# Patient Record
Sex: Female | Born: 1985 | Race: White | Hispanic: No | Marital: Single | Smoking: Never smoker
Health system: Southern US, Community
[De-identification: ages and names within clinical notes are randomized; demographics above are authoritative.]

## PROBLEM LIST (undated history)

## (undated) DIAGNOSIS — G43909 Migraine, unspecified, not intractable, without status migrainosus: Secondary | ICD-10-CM

## (undated) DIAGNOSIS — E282 Polycystic ovarian syndrome: Secondary | ICD-10-CM

## (undated) DIAGNOSIS — F4310 Post-traumatic stress disorder, unspecified: Secondary | ICD-10-CM

## (undated) DIAGNOSIS — F419 Anxiety disorder, unspecified: Secondary | ICD-10-CM

## (undated) DIAGNOSIS — F32A Depression, unspecified: Secondary | ICD-10-CM

## (undated) HISTORY — DX: Migraine, unspecified, not intractable, without status migrainosus: G43.909

## (undated) HISTORY — PX: WISDOM TOOTH EXTRACTION: SHX21

---

## 2007-01-31 ENCOUNTER — Emergency Department (HOSPITAL_COMMUNITY): Admission: EM | Admit: 2007-01-31 | Discharge: 2007-02-01 | Payer: Self-pay | Admitting: Emergency Medicine

## 2008-08-06 ENCOUNTER — Emergency Department (HOSPITAL_COMMUNITY): Admission: EM | Admit: 2008-08-06 | Discharge: 2008-08-06 | Payer: Self-pay | Admitting: Emergency Medicine

## 2009-06-06 ENCOUNTER — Emergency Department (HOSPITAL_COMMUNITY): Admission: EM | Admit: 2009-06-06 | Discharge: 2009-06-06 | Payer: Self-pay | Admitting: Emergency Medicine

## 2011-04-19 LAB — URINALYSIS, ROUTINE W REFLEX MICROSCOPIC
Bilirubin Urine: NEGATIVE
Glucose, UA: NEGATIVE
Ketones, ur: NEGATIVE
Nitrite: NEGATIVE
Specific Gravity, Urine: 1.01
pH: 6.5

## 2011-04-19 LAB — URINE MICROSCOPIC-ADD ON

## 2011-04-19 LAB — PREGNANCY, URINE: Preg Test, Ur: NEGATIVE

## 2011-08-13 ENCOUNTER — Emergency Department (HOSPITAL_COMMUNITY)
Admission: EM | Admit: 2011-08-13 | Discharge: 2011-08-13 | Disposition: A | Discharge status: Home / Self Care | Payer: Self-pay | Attending: Emergency Medicine | Admitting: Emergency Medicine

## 2011-08-13 ENCOUNTER — Encounter (HOSPITAL_COMMUNITY): Payer: Self-pay

## 2011-08-13 ENCOUNTER — Emergency Department (HOSPITAL_COMMUNITY): Payer: Self-pay

## 2011-08-13 DIAGNOSIS — O200 Threatened abortion: Secondary | ICD-10-CM | POA: Insufficient documentation

## 2011-08-13 LAB — WET PREP, GENITAL
Clue Cells Wet Prep HPF POC: NONE SEEN
Yeast Wet Prep HPF POC: NONE SEEN

## 2011-08-13 LAB — URINALYSIS, ROUTINE W REFLEX MICROSCOPIC
Ketones, ur: NEGATIVE mg/dL
Leukocytes, UA: NEGATIVE
Nitrite: NEGATIVE
Protein, ur: NEGATIVE mg/dL
pH: 5.5 (ref 5.0–8.0)

## 2011-08-13 LAB — CBC
Hemoglobin: 12.8 g/dL (ref 12.0–15.0)
MCH: 30.5 pg (ref 26.0–34.0)
MCHC: 34 g/dL (ref 30.0–36.0)
MCV: 90 fL (ref 78.0–100.0)
RBC: 4.19 MIL/uL (ref 3.87–5.11)

## 2011-08-13 LAB — URINE MICROSCOPIC-ADD ON

## 2011-08-13 LAB — PREGNANCY, URINE: Preg Test, Ur: POSITIVE — AB

## 2011-08-13 MED ORDER — HYDROCODONE-ACETAMINOPHEN 5-325 MG PO TABS: 2.0000 | ORAL_TABLET | Freq: Once | ORAL | Status: DC

## 2011-08-13 MED ORDER — HYDROCODONE-ACETAMINOPHEN 5-325 MG PO TABS
1.0000 | ORAL_TABLET | Freq: Once | ORAL | Status: AC
  Administered 2011-08-13: 1 via ORAL
  Filled 2011-08-13: qty 1

## 2011-08-13 NOTE — ED Provider Notes (Addendum)
History     CSN: [id number]  Arrival date & time 08/13/11  1349   First MD Initiated Contact with Patient 08/13/11 1357      Chief Complaint  Patient presents with  . Abdominal Pain  . Nausea    (Consider location/radiation/quality/duration/timing/severity/associated sxs/prior treatment) Patient is a 26 y.o. female presenting with abdominal pain. The history is provided by the patient.  Abdominal Pain The primary symptoms of the illness include abdominal pain. The primary symptoms of the illness do not include fever or dysuria.  Symptoms associated with the illness do not include back pain.  pt c/o lower abd/suprapubic cramping pain in past few days corresponding to her period. States very similar menstrual cramping in past. Takes midol prn. Dull, cramping pain, non radiating. No assoc nv. No fever or chills. Denies constipation or diarrhea. No hx endometriosis or ovarian cyst. No prior abd surgery. Denies exacerbating or alleviating factors. States current period normal, normal time, normal amt bleeding. No back or flank pain. No dysuria or hematuria. No hx kidney stones. Normal appetite.   History reviewed. No pertinent past medical history.  History reviewed. No pertinent past surgical history.  No family history on file.  History  Substance Use Topics  . Smoking status: Never Smoker   . Smokeless tobacco: Not on file  . Alcohol Use: No    OB History    Grav Para Term Preterm Abortions TAB SAB Ect Mult Living                  Review of Systems  Constitutional: Negative for fever.  HENT: Negative for neck pain.   Respiratory: Negative for cough.   Cardiovascular: Negative for chest pain.  Gastrointestinal: Positive for abdominal pain.  Genitourinary: Negative for dysuria and flank pain.  Musculoskeletal: Negative for back pain.  Skin: Negative for rash.  Neurological: Negative for headaches.  Hematological: Does not bruise/bleed easily.  Psychiatric/Behavioral:  Negative for confusion.    Allergies  Amoxapine and related  Home Medications   Current Outpatient Rx  Name Route Sig Dispense Refill  . IBUPROFEN 200 MG PO TABS Oral Take 800 mg by mouth every 6 (six) hours as needed. pain      BP 124/77  Pulse 104  Temp(Src) 97.9 F (36.6 C) (Oral)  Resp 26  Ht 5\' 4"  (1.626 m)  Wt 229 lb (103.874 kg)  BMI 39.31 kg/m2  SpO2 100%  LMP 08/06/2011  Physical Exam  Nursing note and vitals reviewed. Constitutional: She appears well-developed and well-nourished. No distress.  Eyes: Conjunctivae are normal. No scleral icterus.  Neck: Neck supple. No tracheal deviation present.  Cardiovascular: Normal rate.   Pulmonary/Chest: Effort normal. No respiratory distress.  Abdominal: Soft. Normal appearance and bowel sounds are normal. She exhibits no distension and no mass. There is no tenderness. There is no rebound and no guarding.  Genitourinary:       No cva tenderness.  Small amt blood in vaginal, no active bleeding, cervix closed. No cmt. No adx masses/ tenderness.   Musculoskeletal: She exhibits no edema.  Neurological: She is alert.  Skin: Skin is warm and dry. No rash noted.  Psychiatric: She has a normal mood and affect.    ED Course  Procedures (including critical care time)  Results for orders placed during the hospital encounter of 08/13/11  URINALYSIS, ROUTINE W REFLEX MICROSCOPIC      Component Value Range   Color, Urine YELLOW  YELLOW    APPearance CLEAR  CLEAR  Specific Gravity, Urine >1.030 (*) 1.005 - 1.030    pH 5.5  5.0 - 8.0    Glucose, UA NEGATIVE  NEGATIVE (mg/dL)   Hgb urine dipstick LARGE (*) NEGATIVE    Bilirubin Urine NEGATIVE  NEGATIVE    Ketones, ur NEGATIVE  NEGATIVE (mg/dL)   Protein, ur NEGATIVE  NEGATIVE (mg/dL)   Urobilinogen, UA 0.2  0.0 - 1.0 (mg/dL)   Nitrite NEGATIVE  NEGATIVE    Leukocytes, UA NEGATIVE  NEGATIVE   PREGNANCY, URINE      Component Value Range   Preg Test, Ur POSITIVE (*) NEGATIVE    URINE MICROSCOPIC-ADD ON      Component Value Range   Squamous Epithelial / LPF RARE  RARE    WBC, UA 0-2  <3 (WBC/hpf)   RBC / HPF TOO NUMEROUS TO COUNT  <3 (RBC/hpf)   Bacteria, UA FEW (*) RARE   CBC      Component Value Range   WBC 16.7 (*) 4.0 - 10.5 (K/uL)   RBC 4.19  3.87 - 5.11 (MIL/uL)   Hemoglobin 12.8  12.0 - 15.0 (g/dL)   HCT 78.2  95.6 - 21.3 (%)   MCV 90.0  78.0 - 100.0 (fL)   MCH 30.5  26.0 - 34.0 (pg)   MCHC 34.0  30.0 - 36.0 (g/dL)   RDW 08.6  57.8 - 46.9 (%)   Platelets 286  150 - 400 (K/uL)  ABO/RH      Component Value Range   ABO/RH(D) A POS    HCG, QUANTITATIVE, PREGNANCY      Component Value Range   hCG, Beta Chain, Quant, S 102 (*) <5 (mIU/mL)  WET PREP, GENITAL      Component Value Range   Yeast Wet Prep HPF POC NONE SEEN  NONE SEEN    Trich, Wet Prep NONE SEEN  NONE SEEN    Clue Cells Wet Prep HPF POC NONE SEEN  NONE SEEN    WBC, Wet Prep HPF POC FEW (*) NONE SEEN    US Ob Comp Less 14 Wks  08/13/2011  *RADIOLOGY REPORT*  Clinical Data: Pelvic pain.  Rule out ectopic pregnancy. Quantitative beta HCG is 102.  By LMP, the patient is 6 days pregnant.  EDC by LMP is 05/13/2012.  OBSTETRIC <14 WK ULTRASOUND  Technique:  Transabdominal ultrasound was performed for evaluation of the gestation as well as the maternal uterus and adnexal regions.  Comparison:  None  Intrauterine gestational sac: Possible; small fluid collection present measuring 3.2 mm. Yolk sac: Not visualized. Embryo: Not visualized. Cardiac Activity: Not visualized.  MSD: 3.2 mm  5w  0d  Maternal uterus/Adnexae: No subchorionic hemorrhage is identified.  Limited evaluation of the right ovary.  A cystic area seen only on transabdominal portion of the exam measures 4.0 x 4.3 x 3.8 cm.  The left ovary is only seen on the transabdominal portion of the exam and is unremarkable in appearance.  Note is made of thickened endometrium within the lower uterine segment.  Attention to this region is recommended  that follow-up.  IMPRESSION:  1.  Small fluid collection within the endometrial canal may represent intrauterine gestational sac.  However, the lack of visualized yolk sac makes this a difficult determination at this point. 2.  Thickened endometrial canal in the lower uterine segment warrants followup. 3.  No evidence for ectopic pregnancy. 4.  Follow-up serial quantitative beta HCG and follow-up ultrasound is suggested.  Original Report Authenticated By: Patterson Hammersmith, M.D.  US Ob Transvaginal  08/13/2011  *RADIOLOGY REPORT*  Clinical Data: Pelvic pain.  Rule out ectopic pregnancy. Quantitative beta HCG is 102.  By LMP, the patient is 6 days pregnant.  EDC by LMP is 05/13/2012.  OBSTETRIC <14 WK ULTRASOUND  Technique:  Transabdominal ultrasound was performed for evaluation of the gestation as well as the maternal uterus and adnexal regions.  Comparison:  None  Intrauterine gestational sac: Possible; small fluid collection present measuring 3.2 mm. Yolk sac: Not visualized. Embryo: Not visualized. Cardiac Activity: Not visualized.  MSD: 3.2 mm  5w  0d  Maternal uterus/Adnexae: No subchorionic hemorrhage is identified.  Limited evaluation of the right ovary.  A cystic area seen only on transabdominal portion of the exam measures 4.0 x 4.3 x 3.8 cm.  The left ovary is only seen on the transabdominal portion of the exam and is unremarkable in appearance.  Note is made of thickened endometrium within the lower uterine segment.  Attention to this region is recommended that follow-up.  IMPRESSION:  1.  Small fluid collection within the endometrial canal may represent intrauterine gestational sac.  However, the lack of visualized yolk sac makes this a difficult determination at this point. 2.  Thickened endometrial canal in the lower uterine segment warrants followup. 3.  No evidence for ectopic pregnancy. 4.  Follow-up serial quantitative beta HCG and follow-up ultrasound is suggested.  Original Report  Authenticated By: Patterson Hammersmith, M.D.         MDM  Pt has ride, does not have to drive. vicodin 1 po.   Pt back from bathroom, states had bm w resolution pain. abd soft nt.   Discussed pos u preg w pt, quant and additional labs added, u/s added. Pt states lnmp prior to current bleeding was 12/12.  abd soft nt.   Discussed u/s w pt, and need to follow up 2 days time for repeat quant, recheck.   Recheck pt no pain, no bleeding, abd soft nt.    Suzi Roots, MD 08/13/11 1547  Suzi Roots, MD 08/13/11 [phone]

## 2011-08-13 NOTE — ED Notes (Signed)
MD at bedside. 

## 2011-08-13 NOTE — ED Notes (Signed)
Pt DC to home with family.

## 2011-08-13 NOTE — ED Notes (Signed)
MD at bedside. Pelvic tray set up.

## 2011-08-13 NOTE — ED Notes (Signed)
Patient transported to Ultrasound 

## 2011-08-13 NOTE — ED Notes (Signed)
Family at bedside. Patient does not need anything at this time. 

## 2011-08-13 NOTE — ED Notes (Signed)
Pt presents with low abdominal pain and nausea. Pt states he last BM was today. Pt hyperventilating in triage.

## 2011-08-14 LAB — GC/CHLAMYDIA PROBE AMP, GENITAL: Chlamydia, DNA Probe: NEGATIVE

## 2011-08-16 ENCOUNTER — Emergency Department (HOSPITAL_COMMUNITY)
Admission: EM | Admit: 2011-08-16 | Discharge: 2011-08-16 | Disposition: A | Discharge status: Home / Self Care | Payer: Self-pay | Attending: Emergency Medicine | Admitting: Emergency Medicine

## 2011-08-16 ENCOUNTER — Encounter (HOSPITAL_COMMUNITY): Payer: Self-pay

## 2011-08-16 DIAGNOSIS — O200 Threatened abortion: Secondary | ICD-10-CM | POA: Insufficient documentation

## 2011-08-16 NOTE — ED Notes (Signed)
Pt reports found out Friday she was pregnant.  She came for abd pain and was diagnosed with threatened miscarriage.   Had HCG levels drawn and was told to return here for recheck of HCG levels.  Pt says started having vaginal bleeding on 02/03 and says bleeding is stopping now.  Prior to that her lmp was 12/03.  Denies any abd pain at this time.

## 2011-08-16 NOTE — ED Provider Notes (Signed)
History   This chart was scribed for Celene Kras, MD by Clarita Crane. The patient was seen in room APA12/[facility]12 and the patient's care was started at 0913.   CSN: [id number]  Arrival date & time 08/16/11  4098   First MD Initiated Contact with Patient 08/16/11 [phone]      Chief Complaint  Patient presents with  . Threatened Miscarriage    (Consider location/radiation/quality/duration/timing/severity/associated sxs/prior treatment) HPI Autumn Boyd is a 26 y.o. female who presents to the Emergency Department to have HCG levels rechecked after dx with threatened miscarriage in ED several days ago. Patient states she was evaluated in ED by Dr. Denton Lank 3 days ago for abdominal pain and was told at d/c to return to have HCG levels rechecked in several days. States HCG level 3 days ago was 102. Patient notes experiencing vaginal bleeding several days ago but states bleeding has resolved. Currently denies vaginal bleeding, dizziness, lightheadedness. Reports her blood type is A positive.   No pain or complaints now.  History reviewed. No pertinent past medical history.  History reviewed. No pertinent past surgical history.  No family history on file.  History  Substance Use Topics  . Smoking status: Never Smoker   . Smokeless tobacco: Not on file  . Alcohol Use: No    OB History    Grav Para Term Preterm Abortions TAB SAB Ect Mult Living                  Review of Systems 10 Systems reviewed and are negative for acute change except as noted in the HPI.  Allergies  Amoxicillin and Amoxapine and related  Home Medications   Current Outpatient Rx  Name Route Sig Dispense Refill  . IBUPROFEN 200 MG PO TABS Oral Take 800 mg by mouth every 6 (six) hours as needed. pain      BP 122/79  Pulse 101  Temp(Src) 98.5 F (36.9 C) (Oral)  Resp 20  Ht 5\' 4"  (1.626 m)  Wt 229 lb (103.874 kg)  BMI 39.31 kg/m2  LMP 08/08/2011  Physical Exam  Nursing note and vitals  reviewed. Constitutional: She appears well-developed and well-nourished. No distress.  HENT:  Head: Normocephalic and atraumatic.  Right Ear: External ear normal.  Left Ear: External ear normal.  Eyes: Conjunctivae are normal. Right eye exhibits no discharge. Left eye exhibits no discharge. No scleral icterus.  Neck: Neck supple. No tracheal deviation present.  Cardiovascular: Normal rate, regular rhythm and intact distal pulses.   Pulmonary/Chest: Effort normal and breath sounds normal. No stridor. No respiratory distress. She has no wheezes. She has no rales.  Abdominal: Soft. Bowel sounds are normal. She exhibits no distension. There is no tenderness. There is no rebound and no guarding.  Musculoskeletal: She exhibits no edema and no tenderness.  Neurological: She is alert. She has normal strength. No sensory deficit. Cranial nerve deficit:  no gross defecits noted. She exhibits normal muscle tone. She displays no seizure activity. Coordination normal.  Skin: Skin is warm and dry. No rash noted.  Psychiatric: She has a normal mood and affect.    ED Course  Procedures (including critical care time)  DIAGNOSTIC STUDIES:   COORDINATION OF CARE: 10:05AM- Patient informed of current plan for treatment and evaluation and agrees with plan at this time.     Labs Reviewed  HCG, QUANTITATIVE, PREGNANCY - Abnormal; Notable for the following:    hCG, Beta Chain, Quant, S 27 (*)    All  other components within normal limits   No results found.   1. Miscarriage, threatened, early pregnancy       MDM  Patient's quantitative hCG has decreased down to 27. Appears that she is having a miscarriage. Patient has not had any significant bleeding now. She's not had any abdominal pain. She did bring a sample of a blood clot that she had associated with her vaginal bleeding but there does not appear to be tissue on microscopic exam. Patient discharged home in stable condition she is to followup  with a primary care Dr. Routinely.she should have a repeat, multiple at some point in the next week to make sure that that is continued to decline to zero.  I personally performed the services described in this documentation, which was scribed in my presence.  The recorded information has been reviewed and considered.    Celene Kras, MD 08/16/11 1102

## 2012-02-09 ENCOUNTER — Encounter (HOSPITAL_COMMUNITY): Payer: Self-pay | Admitting: *Deleted

## 2012-02-09 ENCOUNTER — Emergency Department (HOSPITAL_COMMUNITY)
Admission: EM | Admit: 2012-02-09 | Discharge: 2012-02-09 | Disposition: A | Discharge status: Home / Self Care | Payer: Self-pay | Attending: Emergency Medicine | Admitting: Emergency Medicine

## 2012-02-09 DIAGNOSIS — N390 Urinary tract infection, site not specified: Secondary | ICD-10-CM | POA: Insufficient documentation

## 2012-02-09 LAB — URINALYSIS, ROUTINE W REFLEX MICROSCOPIC
Ketones, ur: NEGATIVE mg/dL
Protein, ur: 100 mg/dL — AB
Urobilinogen, UA: 1 mg/dL (ref 0.0–1.0)

## 2012-02-09 LAB — HCG, SERUM, QUALITATIVE: Preg, Serum: NEGATIVE

## 2012-02-09 MED ORDER — CIPROFLOXACIN HCL 250 MG PO TABS
500.0000 mg | ORAL_TABLET | Freq: Once | ORAL | Status: AC
  Administered 2012-02-09: 500 mg via ORAL
  Filled 2012-02-09: qty 2

## 2012-02-09 MED ORDER — CIPROFLOXACIN HCL 500 MG PO TABS: 500.0000 mg | ORAL_TABLET | Freq: Two times a day (BID) | ORAL | Status: AC

## 2012-02-09 NOTE — ED Provider Notes (Signed)
History     CSN: [id number]  Arrival date & time 02/09/12  1017   First MD Initiated Contact with Patient 02/09/12 1029      Chief Complaint  Patient presents with  . Dysuria  . Abdominal Pain    (Consider location/radiation/quality/duration/timing/severity/associated sxs/prior treatment) HPI Comments: Dysuria and frequency.  No hematuria.  No fever or chills.  Pt also has  Irregular menses and last was in mid July.    She asks that we do a pregnancy test and previous urine test have been negative even when she was pregnant.  Patient is a 26 y.o. female presenting with dysuria and abdominal pain. The history is provided by the patient. No language interpreter was used.  Dysuria  This is a new problem. Episode onset: several days ago. The problem occurs every urination. The problem has not changed since onset.The quality of the pain is described as burning. The pain is moderate. Associated symptoms include frequency. Pertinent negatives include no chills, no hematuria and no urgency. She has tried nothing for the symptoms.  Abdominal Pain The primary symptoms of the illness include abdominal pain and dysuria. The primary symptoms of the illness do not include fever, vaginal discharge or vaginal bleeding.  The dysuria is associated with frequency. The dysuria is not associated with hematuria or urgency.  Additional symptoms associated with the illness include frequency. Symptoms associated with the illness do not include chills, urgency or hematuria.    History reviewed. No pertinent past medical history.  History reviewed. No pertinent past surgical history.  No family history on file.  History  Substance Use Topics  . Smoking status: Never Smoker   . Smokeless tobacco: Not on file  . Alcohol Use: No    OB History    Grav Para Term Preterm Abortions TAB SAB Ect Mult Living                  Review of Systems  Constitutional: Negative for fever and chills.    Gastrointestinal: Positive for abdominal pain.  Genitourinary: Positive for dysuria and frequency. Negative for urgency, hematuria, vaginal bleeding and vaginal discharge.  All other systems reviewed and are negative.    Allergies  Amoxicillin and Amoxapine and related  Home Medications   Current Outpatient Rx  Name Route Sig Dispense Refill  . ACETAMINOPHEN 500 MG PO TABS Oral Take 500 mg by mouth every 6 (six) hours as needed. For pain/fever    . CIPROFLOXACIN HCL 500 MG PO TABS Oral Take 1 tablet (500 mg total) by mouth 2 (two) times daily. 14 tablet 0  . PRENATAL PO Oral Take 1 tablet by mouth daily.      BP 158/93  Pulse 102  Temp 98.1 F (36.7 C) (Oral)  Resp 16  Ht 5\' 4"  (1.626 m)  Wt 239 lb (108.41 kg)  BMI 41.02 kg/m2  SpO2 100%  LMP 01/14/2012  Physical Exam  Nursing note and vitals reviewed. Constitutional: She is oriented to person, place, and time. She appears well-developed and well-nourished. No distress.  HENT:  Head: Normocephalic and atraumatic.  Eyes: EOM are normal.  Neck: Normal range of motion.  Cardiovascular: Normal rate, regular rhythm and normal heart sounds.   Pulmonary/Chest: Effort normal and breath sounds normal.  Abdominal: Soft. She exhibits no distension. There is tenderness in the suprapubic area. There is no rigidity, no rebound, no guarding, no CVA tenderness, no tenderness at McBurney's point and negative Murphy's sign.    Musculoskeletal: Normal range  of motion.  Neurological: She is alert and oriented to person, place, and time.  Skin: Skin is warm and dry.  Psychiatric: She has a normal mood and affect. Judgment normal.    ED Course  Procedures (including critical care time)  Labs Reviewed  URINALYSIS, ROUTINE W REFLEX MICROSCOPIC - Abnormal; Notable for the following:    Color, Urine BROWN (*)  BIOCHEMICALS MAY BE AFFECTED BY COLOR   APPearance CLOUDY (*)     Glucose, UA 100 (*)     Hgb urine dipstick LARGE (*)      Bilirubin Urine MODERATE (*)     Protein, ur 100 (*)     Nitrite POSITIVE (*)     Leukocytes, UA SMALL (*)     All other components within normal limits  URINE MICROSCOPIC-ADD ON - Abnormal; Notable for the following:    Bacteria, UA MANY (*)     All other components within normal limits  PREGNANCY, URINE  HCG, SERUM, QUALITATIVE  URINE CULTURE   No results found.   1. UTI (urinary tract infection)       MDM  urine cx pending.   rx cippro 500 mg, 14 OTCphenazopyridine F/u with PCP       Evalina Field, PA 02/09/12 1146

## 2012-02-09 NOTE — ED Notes (Signed)
Pt states burning and pressure to lower abdomen  And burning with urination. Urinary frequency. Symptoms began yesterday

## 2012-02-10 NOTE — ED Provider Notes (Signed)
Medical screening examination/treatment/procedure(s) were performed by non-physician practitioner and as supervising physician I was immediately available for consultation/collaboration.   Laray Anger, DO 02/10/12 [phone]

## 2012-02-11 LAB — URINE CULTURE

## 2012-02-12 NOTE — ED Notes (Signed)
+   urine culture. Treated with Cipro, sensitive to same per protocol MD. 

## 2012-11-06 ENCOUNTER — Encounter: Payer: Self-pay | Admitting: *Deleted

## 2012-11-07 ENCOUNTER — Ambulatory Visit (INDEPENDENT_AMBULATORY_CARE_PROVIDER_SITE_OTHER): Payer: Self-pay | Admitting: Obstetrics & Gynecology

## 2012-11-07 ENCOUNTER — Encounter: Payer: Self-pay | Admitting: Obstetrics & Gynecology

## 2012-11-07 VITALS — BP 110/66 | Ht 64.5 in | Wt 248.0 lb

## 2012-11-07 DIAGNOSIS — Z3049 Encounter for surveillance of other contraceptives: Secondary | ICD-10-CM

## 2012-11-07 DIAGNOSIS — Z3200 Encounter for pregnancy test, result unknown: Secondary | ICD-10-CM

## 2012-11-07 DIAGNOSIS — Z3202 Encounter for pregnancy test, result negative: Secondary | ICD-10-CM

## 2012-11-08 ENCOUNTER — Telehealth: Payer: Self-pay | Admitting: Obstetrics & Gynecology

## 2012-11-08 NOTE — Telephone Encounter (Signed)
Left on pts answering machine that test was negative(quant. Hcg). Advised to call if had further questions. JSY

## 2013-06-22 ENCOUNTER — Emergency Department (HOSPITAL_COMMUNITY): Payer: Self-pay

## 2013-06-22 ENCOUNTER — Encounter (HOSPITAL_COMMUNITY): Payer: Self-pay | Admitting: Emergency Medicine

## 2013-06-22 ENCOUNTER — Emergency Department (HOSPITAL_COMMUNITY)
Admission: EM | Admit: 2013-06-22 | Discharge: 2013-06-22 | Disposition: A | Discharge status: Home / Self Care | Payer: Self-pay | Attending: Emergency Medicine | Admitting: Emergency Medicine

## 2013-06-22 DIAGNOSIS — R110 Nausea: Secondary | ICD-10-CM | POA: Insufficient documentation

## 2013-06-22 DIAGNOSIS — N938 Other specified abnormal uterine and vaginal bleeding: Secondary | ICD-10-CM | POA: Insufficient documentation

## 2013-06-22 DIAGNOSIS — Z3202 Encounter for pregnancy test, result negative: Secondary | ICD-10-CM | POA: Insufficient documentation

## 2013-06-22 DIAGNOSIS — N949 Unspecified condition associated with female genital organs and menstrual cycle: Secondary | ICD-10-CM | POA: Insufficient documentation

## 2013-06-22 DIAGNOSIS — R109 Unspecified abdominal pain: Secondary | ICD-10-CM | POA: Insufficient documentation

## 2013-06-22 DIAGNOSIS — Z79899 Other long term (current) drug therapy: Secondary | ICD-10-CM | POA: Insufficient documentation

## 2013-06-22 DIAGNOSIS — Z8679 Personal history of other diseases of the circulatory system: Secondary | ICD-10-CM | POA: Insufficient documentation

## 2013-06-22 LAB — BASIC METABOLIC PANEL
CO2: 25 mEq/L (ref 19–32)
Calcium: 9.6 mg/dL (ref 8.4–10.5)
Creatinine, Ser: 0.72 mg/dL (ref 0.50–1.10)
Glucose, Bld: 87 mg/dL (ref 70–99)
Sodium: 138 mEq/L (ref 135–145)

## 2013-06-22 LAB — CBC WITH DIFFERENTIAL/PLATELET
Eosinophils Relative: 3 % (ref 0–5)
HCT: 38.2 % (ref 36.0–46.0)
Lymphocytes Relative: 31 % (ref 12–46)
Lymphs Abs: 2.3 10*3/uL (ref 0.7–4.0)
MCV: 90.3 fL (ref 78.0–100.0)
Monocytes Absolute: 0.4 10*3/uL (ref 0.1–1.0)
RBC: 4.23 MIL/uL (ref 3.87–5.11)
WBC: 7.5 10*3/uL (ref 4.0–10.5)

## 2013-06-22 LAB — URINALYSIS, ROUTINE W REFLEX MICROSCOPIC
Glucose, UA: NEGATIVE mg/dL
Protein, ur: NEGATIVE mg/dL

## 2013-06-22 LAB — WET PREP, GENITAL
Clue Cells Wet Prep HPF POC: NONE SEEN
Trich, Wet Prep: NONE SEEN
WBC, Wet Prep HPF POC: NONE SEEN

## 2013-06-22 LAB — PREGNANCY, URINE: Preg Test, Ur: NEGATIVE

## 2013-06-22 LAB — URINE MICROSCOPIC-ADD ON

## 2013-06-22 MED ORDER — OXYCODONE-ACETAMINOPHEN 5-325 MG PO TABS: 2.0000 | ORAL_TABLET | ORAL | Status: DC | PRN

## 2013-06-22 MED ORDER — MEDROXYPROGESTERONE ACETATE 10 MG PO TABS: 10.0000 mg | ORAL_TABLET | Freq: Every day | ORAL | Status: DC

## 2013-06-22 NOTE — ED Notes (Signed)
Pt reports that she has been having vaginal bleeding since Nov. 25th, woke this am w/ nausea and ab cramping and headache.  Thinks she may be having a miscarriage. Had similar symptoms in feb. 2013 when she had one.  Took a home home preg. Test nov. 23rd and was negative.

## 2013-06-22 NOTE — ED Provider Notes (Signed)
CSN: [id number]     Arrival date & time 06/22/13  1402 History   First MD Initiated Contact with Patient 06/22/13 1631     Chief Complaint  Patient presents with  . Nausea  . Abdominal Cramping  . Vaginal Bleeding   (Consider location/radiation/quality/duration/timing/severity/associated sxs/prior Treatment) HPI....... lower abdominal cramping pain for the past 24 hours.   Patient has had a long history of dysfunctional uterine bleeding. She was followed by a local gynecologist for some time, but lost her insurance. She is on no hormonal replacement therapy at this time. Bleeding is described as light to heavy at times. She uses 3-4 pad changes daily. No vaginal discharge Past Medical History  Diagnosis Date  . Migraines    History reviewed. No pertinent past surgical history. Family History  Problem Relation Age of Onset  . Diabetes Mother   . Hypertension Father   . Diabetes Father   . Cancer Maternal Grandfather     mouth   History  Substance Use Topics  . Smoking status: Passive Smoke Exposure - Never Smoker  . Smokeless tobacco: Never Used  . Alcohol Use: No   OB History   Grav Para Term Preterm Abortions TAB SAB Ect Mult Living   1    1  1         Review of Systems  All other systems reviewed and are negative.    Allergies  Amoxicillin and Amoxapine and related  Home Medications   Current Outpatient Rx  Name  Route  Sig  Dispense  Refill  . acetaminophen (TYLENOL) 500 MG tablet   Oral   Take 500 mg by mouth every 6 (six) hours as needed. For pain/fever         . pyridoxine (B6 NATURAL) 100 MG tablet   Oral   Take 100 mg by mouth daily.         . vitamin B-12 (CYANOCOBALAMIN) 100 MCG tablet   Oral   Take 100 mcg by mouth daily.         . medroxyPROGESTERone (PROVERA) 10 MG tablet   Oral   Take 1 tablet (10 mg total) by mouth daily.   53 tablet   0     1 tablet 4 times a day for 4 days, then 1 tablet 3 ...   . oxyCODONE-acetaminophen  (PERCOCET) 5-325 MG per tablet   Oral   Take 2 tablets by mouth every 4 (four) hours as needed.   1 tablet   0    BP 122/70  Pulse 93  Temp(Src) 98.1 F (36.7 C) (Oral)  Resp 20  Ht 5\' 4"  (1.626 m)  Wt 240 lb (108.863 kg)  BMI 41.18 kg/m2  SpO2 98%  LMP 05/29/2013 Physical Exam  Nursing note and vitals reviewed. Constitutional: She is oriented to person, place, and time. She appears well-developed and well-nourished.  HENT:  Head: Normocephalic and atraumatic.  Eyes: Conjunctivae and EOM are normal. Pupils are equal, round, and reactive to light.  Neck: Normal range of motion. Neck supple.  Cardiovascular: Normal rate, regular rhythm and normal heart sounds.   Pulmonary/Chest: Effort normal and breath sounds normal.  Abdominal: Soft. Bowel sounds are normal.  Minimal lower abdominal tenderness  Genitourinary:  External vaginal area normal. Slight amount of blood in os.  No cervical motion tenderness. No adnexal tenderness to  Musculoskeletal: Normal range of motion.  Neurological: She is alert and oriented to person, place, and time.  Skin: Skin is warm and dry.  Psychiatric: She has a normal mood and affect. Her behavior is normal.    ED Course  Procedures (including critical care time) Labs Review Labs Reviewed  URINALYSIS, ROUTINE W REFLEX MICROSCOPIC - Abnormal; Notable for the following:    Specific Gravity, Urine >1.030 (*)    Hgb urine dipstick SMALL (*)    All other components within normal limits  WET PREP, GENITAL  GC/CHLAMYDIA PROBE AMP  CBC WITH DIFFERENTIAL  BASIC METABOLIC PANEL  PREGNANCY, URINE  URINE MICROSCOPIC-ADD ON   Imaging Review US Transvaginal Non-ob  06/22/2013   CLINICAL DATA:  Vaginal bleeding  EXAM: TRANSABDOMINAL AND TRANSVAGINAL ULTRASOUND OF PELVIS  TECHNIQUE: Both transabdominal and transvaginal ultrasound examinations of the pelvis were performed. Transabdominal technique was performed for global imaging of the pelvis including  uterus, ovaries, adnexal regions, and pelvic cul-de-sac. It was necessary to proceed with endovaginal exam following the transabdominal exam to visualize the endometrium and left adnexa.  COMPARISON:  None  FINDINGS: Uterus  Measurements: 8.3 x 3.2 x 4.2 cm. No fibroids or other mass visualized. Nabothian cysts in the cervix.  Endometrium  Thickness: 11 mm.  No focal abnormality visualized.  Right ovary  Measurements: 2.2 x 1.5 x 1.6 cm. Only visualized transabdominally. No gross abnormality is seen.  Left ovary  Not visualized transabdominally or transvaginally.  Other findings  No free fluid.  IMPRESSION: Endometrial complex measures 11 mm.  Left ovary is not discretely visualized.  Otherwise negative pelvic ultrasound.   Electronically Signed   By: Charline Bills M.D.   On: 06/22/2013 17:57   US Pelvis Complete  06/22/2013   CLINICAL DATA:  Vaginal bleeding  EXAM: TRANSABDOMINAL AND TRANSVAGINAL ULTRASOUND OF PELVIS  TECHNIQUE: Both transabdominal and transvaginal ultrasound examinations of the pelvis were performed. Transabdominal technique was performed for global imaging of the pelvis including uterus, ovaries, adnexal regions, and pelvic cul-de-sac. It was necessary to proceed with endovaginal exam following the transabdominal exam to visualize the endometrium and left adnexa.  COMPARISON:  None  FINDINGS: Uterus  Measurements: 8.3 x 3.2 x 4.2 cm. No fibroids or other mass visualized. Nabothian cysts in the cervix.  Endometrium  Thickness: 11 mm.  No focal abnormality visualized.  Right ovary  Measurements: 2.2 x 1.5 x 1.6 cm. Only visualized transabdominally. No gross abnormality is seen.  Left ovary  Not visualized transabdominally or transvaginally.  Other findings  No free fluid.  IMPRESSION: Endometrial complex measures 11 mm.  Left ovary is not discretely visualized.  Otherwise negative pelvic ultrasound.   Electronically Signed   By: Charline Bills M.D.   On: 06/22/2013 17:57    EKG  Interpretation   None       MDM   1. Dysfunctional uterine bleeding    Patient is in no acute distress. Pelvic exam shows minimal leading. Ultrasound shows no acute findings. Discharge medications Provera taper. Percocet for pain. Follow up with gynecologist     Donnetta Hutching, MD 06/22/13 2005

## 2014-05-06 ENCOUNTER — Encounter (HOSPITAL_COMMUNITY): Payer: Self-pay | Admitting: Emergency Medicine

## 2014-06-07 ENCOUNTER — Encounter (HOSPITAL_COMMUNITY): Payer: Self-pay | Admitting: *Deleted

## 2014-06-07 ENCOUNTER — Emergency Department (HOSPITAL_COMMUNITY): Payer: Self-pay

## 2014-06-07 ENCOUNTER — Emergency Department (HOSPITAL_COMMUNITY)
Admission: EM | Admit: 2014-06-07 | Discharge: 2014-06-07 | Disposition: A | Discharge status: Home / Self Care | Payer: Self-pay | Attending: Emergency Medicine | Admitting: Emergency Medicine

## 2014-06-07 DIAGNOSIS — R05 Cough: Secondary | ICD-10-CM

## 2014-06-07 DIAGNOSIS — J159 Unspecified bacterial pneumonia: Secondary | ICD-10-CM | POA: Insufficient documentation

## 2014-06-07 DIAGNOSIS — R059 Cough, unspecified: Secondary | ICD-10-CM

## 2014-06-07 DIAGNOSIS — Z79899 Other long term (current) drug therapy: Secondary | ICD-10-CM | POA: Insufficient documentation

## 2014-06-07 DIAGNOSIS — Z792 Long term (current) use of antibiotics: Secondary | ICD-10-CM | POA: Insufficient documentation

## 2014-06-07 DIAGNOSIS — Z880 Allergy status to penicillin: Secondary | ICD-10-CM | POA: Insufficient documentation

## 2014-06-07 DIAGNOSIS — Z7952 Long term (current) use of systemic steroids: Secondary | ICD-10-CM | POA: Insufficient documentation

## 2014-06-07 DIAGNOSIS — Z8679 Personal history of other diseases of the circulatory system: Secondary | ICD-10-CM | POA: Insufficient documentation

## 2014-06-07 DIAGNOSIS — J189 Pneumonia, unspecified organism: Secondary | ICD-10-CM

## 2014-06-07 LAB — CBC WITH DIFFERENTIAL/PLATELET
BASOS PCT: 0 % (ref 0–1)
Band Neutrophils: 0 % (ref 0–10)
Basophils Absolute: 0 10*3/uL (ref 0.0–0.1)
Blasts: 0 %
EOS ABS: 0.1 10*3/uL (ref 0.0–0.7)
EOS PCT: 1 % (ref 0–5)
HCT: 42.2 % (ref 36.0–46.0)
HEMOGLOBIN: 14.5 g/dL (ref 12.0–15.0)
LYMPHS ABS: 1 10*3/uL (ref 0.7–4.0)
LYMPHS PCT: 12 % (ref 12–46)
MCH: 30.7 pg (ref 26.0–34.0)
MCHC: 34.4 g/dL (ref 30.0–36.0)
MCV: 89.2 fL (ref 78.0–100.0)
MONO ABS: 0.7 10*3/uL (ref 0.1–1.0)
MONOS PCT: 9 % (ref 3–12)
Metamyelocytes Relative: 0 %
Myelocytes: 0 %
NEUTROS ABS: 6.3 10*3/uL (ref 1.7–7.7)
NEUTROS PCT: 78 % — AB (ref 43–77)
NRBC: 0 /100{WBCs}
PLATELETS: 266 10*3/uL (ref 150–400)
Promyelocytes Absolute: 0 %
RBC: 4.73 MIL/uL (ref 3.87–5.11)
RDW: 13.7 % (ref 11.5–15.5)
WBC: 8.1 10*3/uL (ref 4.0–10.5)

## 2014-06-07 LAB — BASIC METABOLIC PANEL
Anion gap: 16 — ABNORMAL HIGH (ref 5–15)
BUN: 7 mg/dL (ref 6–23)
CALCIUM: 9.5 mg/dL (ref 8.4–10.5)
CO2: 27 mEq/L (ref 19–32)
CREATININE: 0.75 mg/dL (ref 0.50–1.10)
Chloride: 96 mEq/L (ref 96–112)
GFR calc Af Amer: >90 mL/min (ref >90)
GLUCOSE: 106 mg/dL — AB (ref 70–99)
POTASSIUM: 3.3 meq/L — AB (ref 3.7–5.3)
Sodium: 139 mEq/L (ref 137–147)

## 2014-06-07 LAB — HCG, QUANTITATIVE, PREGNANCY: hCG, Beta Chain, Quant, S: <1 m[IU]/mL (ref <5)

## 2014-06-07 MED ORDER — ALBUTEROL SULFATE (2.5 MG/3ML) 0.083% IN NEBU
2.5000 mg | INHALATION_SOLUTION | Freq: Once | RESPIRATORY_TRACT | Status: AC
  Administered 2014-06-07: 2.5 mg via RESPIRATORY_TRACT

## 2014-06-07 MED ORDER — HYDROCODONE-HOMATROPINE 5-1.5 MG/5ML PO SYRP: 5.0000 mL | ORAL_SOLUTION | Freq: Four times a day (QID) | ORAL | Status: DC | PRN

## 2014-06-07 MED ORDER — IPRATROPIUM-ALBUTEROL 0.5-2.5 (3) MG/3ML IN SOLN
3.0000 mL | Freq: Once | RESPIRATORY_TRACT | Status: AC
  Administered 2014-06-07: 3 mL via RESPIRATORY_TRACT

## 2014-06-07 MED ORDER — ALBUTEROL SULFATE HFA 108 (90 BASE) MCG/ACT IN AERS: 1.0000 | INHALATION_SPRAY | Freq: Four times a day (QID) | RESPIRATORY_TRACT | Status: DC | PRN

## 2014-06-07 MED ORDER — PREDNISONE 50 MG PO TABS: ORAL_TABLET | ORAL | Status: DC

## 2014-06-07 MED ORDER — AZITHROMYCIN 250 MG PO TABS: ORAL_TABLET | ORAL | Status: DC

## 2014-06-07 MED ORDER — AZITHROMYCIN 250 MG PO TABS
500.0000 mg | ORAL_TABLET | Freq: Once | ORAL | Status: AC
  Administered 2014-06-07: 500 mg via ORAL

## 2014-06-07 NOTE — ED Notes (Signed)
Hoarse, cold sx, for 2 weeks, with  Cough,  Green sputum.  Lt calf "cramp" for  3 days,  Had epistaxis today,  diarrhea

## 2014-06-07 NOTE — ED Notes (Signed)
Advised patient to drink plenty of fluids

## 2014-06-07 NOTE — Discharge Instructions (Signed)
Chest x-ray shows pneumonia.  Start the remainder of your antibiotic prescription tomorrow evening. Prescriptions for inhaler, cough syrup, prednisone. Increase fluids. Avoid cigarette smoke

## 2014-06-08 NOTE — ED Provider Notes (Signed)
CSN: [id number]     Arrival date & time 06/07/14  1812 History   First MD Initiated Contact with Patient 06/07/14 1912     Chief Complaint  Patient presents with  . URI     (Consider location/radiation/quality/duration/timing/severity/associated sxs/prior Treatment) HPI.... Productive cough for 2 weeks with associated wheezing. No fever, chills, rusty sputum. Patient is ambulatory. Normal appetite. Severity is mild to moderate. Chest wall hurts with deep breath.  Past Medical History  Diagnosis Date  . Migraines    History reviewed. No pertinent past surgical history. Family History  Problem Relation Age of Onset  . Diabetes Mother   . Hypertension Father   . Diabetes Father   . Cancer Maternal Grandfather     mouth   History  Substance Use Topics  . Smoking status: Passive Smoke Exposure - Never Smoker  . Smokeless tobacco: Never Used  . Alcohol Use: No   OB History    Gravida Para Term Preterm AB TAB SAB Ectopic Multiple Living   1    1  1         Review of Systems  All other systems reviewed and are negative.     Allergies  Amoxicillin and Amoxapine and related  Home Medications   Prior to Admission medications   Medication Sig Start Date End Date Taking? Authorizing Provider  acetaminophen (TYLENOL) 500 MG tablet Take 500 mg by mouth every 6 (six) hours as needed. For pain/fever   Yes Historical Provider, MD  PE-DM-APAP & Doxylamin-DM-APAP (VICKS DAYQUIL/NYQUIL CLD & FLU) (LIQUID) MISC Take 15-30 mLs by mouth daily as needed (for cough/cold).   Yes Historical Provider, MD  Phenylephrine-APAP-Guaifenesin [facility](THERAFLU WARMING COLD & CHEST) 5-325-200 MG/15ML LIQD Take 30 mLs by mouth every 4 (four) hours as needed (for cold and cough).   Yes Historical Provider, MD  albuterol (PROVENTIL HFA;VENTOLIN HFA) 108 (90 BASE) MCG/ACT inhaler Inhale 1-2 puffs into the lungs every 6 (six) hours as needed for wheezing or shortness of breath. 06/07/14   Donnetta HutchingBrian Gelena Klosinski, MD   azithromycin (ZITHROMAX) 250 MG tablet One tab daily for 4 days starting tomorrow afternoon 06/07/14   Donnetta HutchingBrian Kaelee Pfeffer, MD  HYDROcodone-homatropine [facility](HYCODAN) 5-1.5 MG/5ML syrup Take 5 mLs by mouth every 6 (six) hours as needed for cough. 06/07/14   Donnetta HutchingBrian Veria Stradley, MD  medroxyPROGESTERone (PROVERA) 10 MG tablet Take 1 tablet (10 mg total) by mouth daily. Patient not taking: Reported on 06/07/2014 06/22/13   Donnetta HutchingBrian Shaunessy Dobratz, MD  oxyCODONE-acetaminophen (PERCOCET) 5-325 MG per tablet Take 2 tablets by mouth every 4 (four) hours as needed. Patient not taking: Reported on 06/07/2014 06/22/13   Donnetta HutchingBrian Nesha Counihan, MD  predniSONE (DELTASONE) 50 MG tablet One tab daily for 4 days;  1/2 half tab daily for 4days 06/07/14   Donnetta HutchingBrian Bralyn Espino, MD   BP 142/68 mmHg  Pulse 84  Temp(Src) 99.4 F (37.4 C) (Oral)  Resp 16  SpO2 98%  LMP 05/24/2014 Physical Exam  Constitutional: She is oriented to person, place, and time. She appears well-developed and well-nourished.  HENT:  Head: Normocephalic and atraumatic.  Eyes: Conjunctivae and EOM are normal. Pupils are equal, round, and reactive to light.  Neck: Normal range of motion. Neck supple.  Cardiovascular: Normal rate, regular rhythm and normal heart sounds.   Pulmonary/Chest: Effort normal.  Minimal bilateral wheezing  Abdominal: Soft. Bowel sounds are normal.  Musculoskeletal: Normal range of motion.  Neurological: She is alert and oriented to person, place, and time.  Skin: Skin is warm and dry.  Psychiatric: She has a normal mood and affect. Her behavior is normal.  Nursing note and vitals reviewed.   ED Course  Procedures (including critical care time) Labs Review Labs Reviewed  CBC WITH DIFFERENTIAL - Abnormal; Notable for the following:    Neutrophils Relative % 78 (*)    All other components within normal limits  BASIC METABOLIC PANEL - Abnormal; Notable for the following:    Potassium 3.3 (*)    Glucose, Bld 106 (*)    Anion gap 16 (*)    All other components  within normal limits  HCG, QUANTITATIVE, PREGNANCY    Imaging Review Dg Chest 2 View  06/07/2014   CLINICAL DATA:  Shortness of breath, chest pain, productive cough  EXAM: CHEST  2 VIEW  COMPARISON:  None.  FINDINGS: Lingular opacity, overlying the heart on the lateral view, suspicious for pneumonia. No pleural effusion or pneumothorax.  Heart is top-normal in size.  Visualized osseous structures are within normal limits.  IMPRESSION: Lingular pneumonia.   Electronically Signed   By: Charline BillsSriyesh  Krishnan M.D.   On: 06/07/2014 20:21     EKG Interpretation None      MDM   Final diagnoses:  Community acquired pneumonia    Patient is nontoxic-appearing. She feels better after albuterol/Atrovent nebulizer treatment. Chest x-ray shows a lingular pneumonia. Rx Zithromax, albuterol inhaler, prednisone, hycodan cough syrup    Donnetta HutchingBrian Amyra Vantuyl, MD 06/08/14 1555

## 2014-06-11 ENCOUNTER — Emergency Department (HOSPITAL_COMMUNITY)
Admission: EM | Admit: 2014-06-11 | Discharge: 2014-06-11 | Disposition: A | Discharge status: Home / Self Care | Payer: Self-pay | Attending: Emergency Medicine | Admitting: Emergency Medicine

## 2014-06-11 ENCOUNTER — Emergency Department (HOSPITAL_COMMUNITY): Payer: Self-pay

## 2014-06-11 ENCOUNTER — Encounter (HOSPITAL_COMMUNITY): Payer: Self-pay | Admitting: *Deleted

## 2014-06-11 DIAGNOSIS — R059 Cough, unspecified: Secondary | ICD-10-CM

## 2014-06-11 DIAGNOSIS — J189 Pneumonia, unspecified organism: Secondary | ICD-10-CM

## 2014-06-11 DIAGNOSIS — J159 Unspecified bacterial pneumonia: Secondary | ICD-10-CM | POA: Insufficient documentation

## 2014-06-11 DIAGNOSIS — Z880 Allergy status to penicillin: Secondary | ICD-10-CM | POA: Insufficient documentation

## 2014-06-11 DIAGNOSIS — Z7952 Long term (current) use of systemic steroids: Secondary | ICD-10-CM | POA: Insufficient documentation

## 2014-06-11 DIAGNOSIS — Z792 Long term (current) use of antibiotics: Secondary | ICD-10-CM | POA: Insufficient documentation

## 2014-06-11 DIAGNOSIS — Z8679 Personal history of other diseases of the circulatory system: Secondary | ICD-10-CM | POA: Insufficient documentation

## 2014-06-11 DIAGNOSIS — R079 Chest pain, unspecified: Secondary | ICD-10-CM | POA: Insufficient documentation

## 2014-06-11 DIAGNOSIS — H6691 Otitis media, unspecified, right ear: Secondary | ICD-10-CM | POA: Insufficient documentation

## 2014-06-11 DIAGNOSIS — R062 Wheezing: Secondary | ICD-10-CM | POA: Insufficient documentation

## 2014-06-11 DIAGNOSIS — R040 Epistaxis: Secondary | ICD-10-CM | POA: Insufficient documentation

## 2014-06-11 DIAGNOSIS — R05 Cough: Secondary | ICD-10-CM

## 2014-06-11 MED ORDER — LEVOFLOXACIN 500 MG PO TABS: 500.0000 mg | ORAL_TABLET | Freq: Every day | ORAL | Status: DC

## 2014-06-11 MED ORDER — OXYCODONE-ACETAMINOPHEN 5-325 MG PO TABS: 1.0000 | ORAL_TABLET | Freq: Four times a day (QID) | ORAL | Status: DC | PRN

## 2014-06-11 MED ORDER — GUAIFENESIN-DM 100-10 MG/5ML PO SYRP: 5.0000 mL | ORAL_SOLUTION | ORAL | Status: DC | PRN

## 2014-06-11 NOTE — ED Provider Notes (Signed)
CSN: [id number]     Arrival date & time 06/11/14  1925 History   First MD Initiated Contact with Patient 06/11/14 2044     Chief Complaint  Patient presents with  . Cough     (Consider location/radiation/quality/duration/timing/severity/associated sxs/prior Treatment) Patient is a 28 y.o. female presenting with cough. The history is provided by the patient.  Cough Associated symptoms: chest pain, chills and ear pain   Associated symptoms: no headaches, no rash and no shortness of breath    patient's had a cough for the last 2-3 weeks. Seen in the ER 4 days ago and diagnosed with lingular pneumonia. Given steroids, an inhaler, cough medicine, and azithromycin. Patient states she has not gotten any better. She states that she is hurting more in her upper chest. She also states that she has pain in her right ear now. She states she feels that her voice is getting more harsh. She states that she also has had occasional nosebleeds. She states she's had some chills. She states she is feeling no better after the treatments.  Past Medical History  Diagnosis Date  . Migraines    History reviewed. No pertinent past surgical history. Family History  Problem Relation Age of Onset  . Diabetes Mother   . Hypertension Father   . Diabetes Father   . Cancer Maternal Grandfather     mouth   History  Substance Use Topics  . Smoking status: Passive Smoke Exposure - Never Smoker  . Smokeless tobacco: Never Used  . Alcohol Use: No   OB History    Gravida Para Term Preterm AB TAB SAB Ectopic Multiple Living   1    1  1         Review of Systems  Constitutional: Positive for chills and appetite change. Negative for activity change.  HENT: Positive for ear pain, hearing loss and nosebleeds. Negative for facial swelling, postnasal drip, sinus pressure and voice change.   Eyes: Negative for pain.  Respiratory: Positive for cough. Negative for chest tightness and shortness of breath.    Cardiovascular: Positive for chest pain. Negative for leg swelling.  Gastrointestinal: Negative for nausea, vomiting, abdominal pain and diarrhea.  Genitourinary: Negative for flank pain.  Musculoskeletal: Negative for back pain and neck stiffness.  Skin: Negative for rash.  Neurological: Negative for weakness, numbness and headaches.  Hematological: Does not bruise/bleed easily.  Psychiatric/Behavioral: Negative for behavioral problems.      Allergies  Amoxicillin and Amoxapine and related  Home Medications   Prior to Admission medications   Medication Sig Start Date End Date Taking? Authorizing Provider  acetaminophen (TYLENOL) 500 MG tablet Take 500 mg by mouth every 6 (six) hours as needed. For pain/fever   Yes Historical Provider, MD  albuterol (PROVENTIL HFA;VENTOLIN HFA) 108 (90 BASE) MCG/ACT inhaler Inhale 1-2 puffs into the lungs every 6 (six) hours as needed for wheezing or shortness of breath. 06/07/14  Yes Donnetta HutchingBrian Cook, MD  HYDROcodone-homatropine [facility](HYCODAN) 5-1.5 MG/5ML syrup Take 5 mLs by mouth every 6 (six) hours as needed for cough. 06/07/14  Yes Donnetta HutchingBrian Cook, MD  PE-DM-APAP & Doxylamin-DM-APAP (VICKS DAYQUIL/NYQUIL CLD & FLU) (LIQUID) MISC Take 15-30 mLs by mouth daily as needed (for cough/cold).   Yes Historical Provider, MD  Phenylephrine-APAP-Guaifenesin [facility](THERAFLU WARMING COLD & CHEST) 5-325-200 MG/15ML LIQD Take 30 mLs by mouth every 4 (four) hours as needed (for cold and cough).   Yes Historical Provider, MD  predniSONE (DELTASONE) 50 MG tablet One tab daily for 4 days;  1/2 half tab daily for 4days Patient taking differently: Take 25-50 mg by mouth as directed. One tab daily for 4 days;  1/2 half tab daily for 4days 06/07/14  Yes Donnetta HutchingBrian Cook, MD  guaiFENesin-dextromethorphan [facility](ROBITUSSIN DM) 100-10 MG/5ML syrup Take 5 mLs by mouth every 4 (four) hours as needed for cough. 06/11/14   Juliet RudeNathan R. Lan Mcneill, MD  levofloxacin (LEVAQUIN) 500 MG tablet Take 1 tablet (500 mg total) by  mouth daily. 06/11/14   Juliet RudeNathan R. Madsen Riddle, MD  medroxyPROGESTERone (PROVERA) 10 MG tablet Take 1 tablet (10 mg total) by mouth daily. Patient not taking: Reported on 06/07/2014 06/22/13   Donnetta HutchingBrian Cook, MD  oxyCODONE-acetaminophen (PERCOCET) 5-325 MG per tablet Take 1-2 tablets by mouth every 6 (six) hours as needed for moderate pain. 06/11/14   Juliet RudeNathan R. Acy Orsak, MD   BP 120/78 mmHg  Pulse 66  Temp(Src) 98.1 F (36.7 C) (Oral)  Resp 18  Ht 5\' 5"  (1.651 m)  Wt 260 lb (117.935 kg)  BMI 43.27 kg/m2  SpO2 97%  LMP 05/21/2014 Physical Exam  Constitutional: She appears well-developed and well-nourished.  HENT:  Mouth/Throat: Oropharynx is clear and moist.  Swelling and injection of the right TM.  Cardiovascular: Normal rate.   Pulmonary/Chest: She has wheezes.  Few scattered wheezes.  Abdominal: Soft.  Musculoskeletal: She exhibits no edema.  Neurological: She is alert.  Skin: Skin is warm.    ED Course  Procedures (including critical care time) Labs Review Labs Reviewed - No data to display  Imaging Review Dg Chest 2 View  06/11/2014   CLINICAL DATA:  Cough, congestion and a sore throat and pain. Diagnosed with pneumonia 4 days ago.  EXAM: CHEST  2 VIEW  COMPARISON:  Chest radiograph June 07, 2014  FINDINGS: Persistent lingular consolidation. No new airspace opacity. No pleural effusion. No pneumothorax. Cardiomediastinal silhouette is unremarkable. Mildly reversed thoracolumbar kyphosis. Soft tissue planes are nonsuspicious.  IMPRESSION: Persistent lingular consolidation/pneumonia.   Electronically Signed   By: Awilda Metroourtnay  Bloomer   On: 06/11/2014 20:59     EKG Interpretation None      MDM   Final diagnoses:  Cough  CAP (community acquired pneumonia)  Acute right otitis media, recurrence not specified, unspecified otitis media type   Patient with continued cough or pneumonia symptoms. Has been on azithromycin without relief. X-ray shows continued pneumonia, but we are  somewhat early for clearance radiographically. Will treat with different antibiotic due to resistance pattern and continued symptoms. Also now has otitis media. Patient has a penicillin allergy and Levaquin should cover both. Will also give some new cough medicine and pain medicine.    Juliet RudeNathan R. Rubin PayorPickering, MD 06/11/14 2118

## 2014-06-11 NOTE — Discharge Instructions (Signed)
Otitis Media °Otitis media is redness, soreness, and inflammation of the middle ear. Otitis media may be caused by allergies or, most commonly, by infection. Often it occurs as a complication of the common cold. °SIGNS AND SYMPTOMS °Symptoms of otitis media may include: °· Earache. °· Fever. °· Ringing in your ear. °· Headache. °· Leakage of fluid from the ear. °DIAGNOSIS °To diagnose otitis media, your health care provider will examine your ear with an otoscope. This is an instrument that allows your health care provider to see into your ear in order to examine your eardrum. Your health care provider also will ask you questions about your symptoms. °TREATMENT  °Typically, otitis media resolves on its own within 3-5 days. Your health care provider may prescribe medicine to ease your symptoms of pain. If otitis media does not resolve within 5 days or is recurrent, your health care provider may prescribe antibiotic medicines if he or she suspects that a bacterial infection is the cause. °HOME CARE INSTRUCTIONS  °· If you were prescribed an antibiotic medicine, finish it all even if you start to feel better. °· Take medicines only as directed by your health care provider. °· Keep all follow-up visits as directed by your health care provider. °SEEK MEDICAL CARE IF: °· You have otitis media only in one ear, or bleeding from your nose, or both. °· You notice a lump on your neck. °· You are not getting better in 3-5 days. °· You feel worse instead of better. °SEEK IMMEDIATE MEDICAL CARE IF:  °· You have pain that is not controlled with medicine. °· You have swelling, redness, or pain around your ear or stiffness in your neck. °· You notice that part of your face is paralyzed. °· You notice that the bone behind your ear (mastoid) is tender when you touch it. °MAKE SURE YOU:  °· Understand these instructions. °· Will watch your condition. °· Will get help right away if you are not doing well or get worse. °Document Released:  03/26/2004 Document Revised: 11/05/2013 Document Reviewed: 01/16/2013 °[employer]® Patient Information ©2015 [employer]. This information is not intended to replace advice given to you by your health care provider. Make sure you discuss any questions you have with your health care provider. ° °Pneumonia °Pneumonia is an infection of the lungs.  °CAUSES °Pneumonia may be caused by bacteria or a virus. Usually, these infections are caused by breathing infectious particles into the lungs (respiratory tract). °SIGNS AND SYMPTOMS  °· Cough. °· Fever. °· Chest pain. °· Increased rate of breathing. °· Wheezing. °· Mucus production. °DIAGNOSIS  °If you have the common symptoms of pneumonia, your health care provider will typically confirm the diagnosis with a chest X-ray. The X-ray will show an abnormality in the lung (pulmonary infiltrate) if you have pneumonia. Other tests of your blood, urine, or sputum may be done to find the specific cause of your pneumonia. Your health care provider may also do tests (blood gases or pulse oximetry) to see how well your lungs are working. °TREATMENT  °Some forms of pneumonia may be spread to other people when you cough or sneeze. You may be asked to wear a mask before and during your exam. Pneumonia that is caused by bacteria is treated with antibiotic medicine. Pneumonia that is caused by the influenza virus may be treated with an antiviral medicine. Most other viral infections must run their course. These infections will not respond to antibiotics.  °HOME CARE INSTRUCTIONS  °· Cough suppressants may   be used if you are losing too much rest. However, coughing protects you by clearing your lungs. You should avoid using cough suppressants if you can. °· Your health care provider may have prescribed medicine if he or she thinks your pneumonia is caused by bacteria or influenza. Finish your medicine even if you start to feel better. °· Your health care provider may also prescribe an  expectorant. This loosens the mucus to be coughed up. °· Take medicines only as directed by your health care provider. °· Do not smoke. Smoking is a common cause of bronchitis and can contribute to pneumonia. If you are a smoker and continue to smoke, your cough may last several weeks after your pneumonia has cleared. °· A cold steam vaporizer or humidifier in your room or home may help loosen mucus. °· Coughing is often worse at night. Sleeping in a semi-upright position in a recliner or using a couple pillows under your head will help with this. °· Get rest as you feel it is needed. Your body will usually let you know when you need to rest. °PREVENTION °A pneumococcal shot (vaccine) is available to prevent a common bacterial cause of pneumonia. This is usually suggested for: °· People over 65 years old. °· Patients on chemotherapy. °· People with chronic lung problems, such as bronchitis or emphysema. °· People with immune system problems. °If you are over 65 or have a high risk condition, you may receive the pneumococcal vaccine if you have not received it before. In some countries, a routine influenza vaccine is also recommended. This vaccine can help prevent some cases of pneumonia. You may be offered the influenza vaccine as part of your care. °If you smoke, it is time to quit. You may receive instructions on how to stop smoking. Your health care provider can provide medicines and counseling to help you quit. °SEEK MEDICAL CARE IF: °You have a fever. °SEEK IMMEDIATE MEDICAL CARE IF:  °· Your illness becomes worse. This is especially true if you are elderly or weakened from any other disease. °· You cannot control your cough with suppressants and are losing sleep. °· You begin coughing up blood. °· You develop pain which is getting worse or is uncontrolled with medicines. °· Any of the symptoms which initially brought you in for treatment are getting worse rather than better. °· You develop shortness of breath  or chest pain. °MAKE SURE YOU:  °· Understand these instructions. °· Will watch your condition. °· Will get help right away if you are not doing well or get worse. °Document Released: 06/21/2005 Document Revised: 11/05/2013 Document Reviewed: 09/10/2010 °[employer]® Patient Information ©2015 [employer]. This information is not intended to replace advice given to you by your health care provider. Make sure you discuss any questions you have with your health care provider. ° °

## 2014-06-11 NOTE — ED Notes (Addendum)
Pt states she was told she was here on Dec. 4th and was told she had pneumonia and is still coughing, wheezing, is dizzy. Pt thinks meds are working. Pt also right sided ear pain.

## 2014-09-25 ENCOUNTER — Emergency Department (HOSPITAL_COMMUNITY)
Admission: EM | Admit: 2014-09-25 | Discharge: 2014-09-25 | Disposition: A | Discharge status: Home / Self Care | Payer: Self-pay | Attending: Emergency Medicine | Admitting: Emergency Medicine

## 2014-09-25 ENCOUNTER — Encounter (HOSPITAL_COMMUNITY): Payer: Self-pay | Admitting: Emergency Medicine

## 2014-09-25 DIAGNOSIS — R51 Headache: Secondary | ICD-10-CM | POA: Insufficient documentation

## 2014-09-25 DIAGNOSIS — Z880 Allergy status to penicillin: Secondary | ICD-10-CM | POA: Insufficient documentation

## 2014-09-25 DIAGNOSIS — N938 Other specified abnormal uterine and vaginal bleeding: Secondary | ICD-10-CM | POA: Insufficient documentation

## 2014-09-25 DIAGNOSIS — R197 Diarrhea, unspecified: Secondary | ICD-10-CM | POA: Insufficient documentation

## 2014-09-25 DIAGNOSIS — E669 Obesity, unspecified: Secondary | ICD-10-CM | POA: Insufficient documentation

## 2014-09-25 DIAGNOSIS — Z79899 Other long term (current) drug therapy: Secondary | ICD-10-CM | POA: Insufficient documentation

## 2014-09-25 DIAGNOSIS — Z8679 Personal history of other diseases of the circulatory system: Secondary | ICD-10-CM | POA: Insufficient documentation

## 2014-09-25 DIAGNOSIS — Z3202 Encounter for pregnancy test, result negative: Secondary | ICD-10-CM | POA: Insufficient documentation

## 2014-09-25 DIAGNOSIS — R112 Nausea with vomiting, unspecified: Secondary | ICD-10-CM | POA: Insufficient documentation

## 2014-09-25 DIAGNOSIS — Z792 Long term (current) use of antibiotics: Secondary | ICD-10-CM | POA: Insufficient documentation

## 2014-09-25 LAB — CBC WITH DIFFERENTIAL/PLATELET
BASOS ABS: 0 10*3/uL (ref 0.0–0.1)
BASOS PCT: 0 % (ref 0–1)
Eosinophils Absolute: 0.1 10*3/uL (ref 0.0–0.7)
Eosinophils Relative: 1 % (ref 0–5)
HCT: 44 % (ref 36.0–46.0)
Hemoglobin: 14.5 g/dL (ref 12.0–15.0)
Lymphocytes Relative: 8 % — ABNORMAL LOW (ref 12–46)
Lymphs Abs: 1 10*3/uL (ref 0.7–4.0)
MCH: 30.5 pg (ref 26.0–34.0)
MCHC: 33 g/dL (ref 30.0–36.0)
MCV: 92.4 fL (ref 78.0–100.0)
MONOS PCT: 5 % (ref 3–12)
Monocytes Absolute: 0.6 10*3/uL (ref 0.1–1.0)
Neutro Abs: 11.1 10*3/uL — ABNORMAL HIGH (ref 1.7–7.7)
Neutrophils Relative %: 86 % — ABNORMAL HIGH (ref 43–77)
PLATELETS: 294 10*3/uL (ref 150–400)
RBC: 4.76 MIL/uL (ref 3.87–5.11)
RDW: 13.5 % (ref 11.5–15.5)
WBC: 12.8 10*3/uL — AB (ref 4.0–10.5)

## 2014-09-25 LAB — URINALYSIS, ROUTINE W REFLEX MICROSCOPIC
Glucose, UA: NEGATIVE mg/dL
KETONES UR: 40 mg/dL — AB
LEUKOCYTES UA: NEGATIVE
NITRITE: NEGATIVE
PH: 6.5 (ref 5.0–8.0)
PROTEIN: 30 mg/dL — AB
Specific Gravity, Urine: 1.02 (ref 1.005–1.030)
Urobilinogen, UA: 0.2 mg/dL (ref 0.0–1.0)

## 2014-09-25 LAB — PREGNANCY, URINE: Preg Test, Ur: NEGATIVE

## 2014-09-25 LAB — URINE MICROSCOPIC-ADD ON

## 2014-09-25 MED ORDER — PROMETHAZINE HCL 25 MG PO TABS: 25.0000 mg | ORAL_TABLET | Freq: Three times a day (TID) | ORAL | Status: DC | PRN

## 2014-09-25 MED ORDER — HYDROCODONE-ACETAMINOPHEN 5-325 MG PO TABS
1.0000 | ORAL_TABLET | Freq: Once | ORAL | Status: AC
  Administered 2014-09-25: 1 via ORAL
  Filled 2014-09-25: qty 1

## 2014-09-25 MED ORDER — ONDANSETRON 4 MG PO TBDP
4.0000 mg | ORAL_TABLET | Freq: Once | ORAL | Status: AC
  Administered 2014-09-25: 4 mg via ORAL
  Filled 2014-09-25: qty 1

## 2014-09-25 MED ORDER — PROMETHAZINE HCL 12.5 MG PO TABS
25.0000 mg | ORAL_TABLET | Freq: Once | ORAL | Status: AC
  Administered 2014-09-25: 25 mg via ORAL
  Filled 2014-09-25: qty 2

## 2014-09-25 MED ORDER — LOPERAMIDE HCL 2 MG PO CAPS
4.0000 mg | ORAL_CAPSULE | Freq: Once | ORAL | Status: AC
  Administered 2014-09-25: 4 mg via ORAL
  Filled 2014-09-25: qty 2

## 2014-09-25 MED ORDER — HYDROCODONE-ACETAMINOPHEN 5-325 MG PO TABS: 1.0000 | ORAL_TABLET | Freq: Four times a day (QID) | ORAL | Status: DC | PRN

## 2014-09-25 MED ORDER — IBUPROFEN 400 MG PO TABS
400.0000 mg | ORAL_TABLET | Freq: Once | ORAL | Status: AC
  Administered 2014-09-25: 400 mg via ORAL
  Filled 2014-09-25: qty 1

## 2014-09-25 NOTE — ED Notes (Signed)
Pt c/o vomiting with diarrhea since midnight.

## 2014-09-25 NOTE — ED Notes (Signed)
Urine specimen obtained and sent to lab

## 2014-09-25 NOTE — ED Provider Notes (Signed)
CSN: [id number]     Arrival date & time 09/25/14  0459 History   First MD Initiated Contact with Patient 09/25/14 0501     Chief Complaint  Patient presents with  . Emesis     (Consider location/radiation/quality/duration/timing/severity/associated sxs/prior Treatment) HPI  This is a 29 year old female with a history of migraines who presents with nausea, vomiting, and diarrhea. Reports onset of symptoms at midnight. Has had sick contacts with similar symptoms. Denies any fevers. Reports multiple episodes of nonbilious, nonbloody emesis and multiple episodes of diarrhea. Also reports headache. Reports epigastric, crampy abdominal pain that radiates around the ribs and is worse with vomiting.  Currently rated 8 out of 10. On review of system, patient reports vaginal bleeding. Reports on and off increased vaginal bleeding over the last 3 months. History of irregular periods. Denies any current dizziness but states that she felt lightheaded on Sunday. Unsure of whether she could be pregnant. Denies urinary symptoms.  Past Medical History  Diagnosis Date  . Migraines    History reviewed. No pertinent past surgical history. Family History  Problem Relation Age of Onset  . Diabetes Mother   . Hypertension Father   . Diabetes Father   . Cancer Maternal Grandfather     mouth   History  Substance Use Topics  . Smoking status: Passive Smoke Exposure - Never Smoker  . Smokeless tobacco: Never Used  . Alcohol Use: No   OB History    Gravida Para Term Preterm AB TAB SAB Ectopic Multiple Living   1    1  1         Review of Systems  Constitutional: Negative for fever.  Eyes: Negative for visual disturbance.  Respiratory: Negative for cough, chest tightness and shortness of breath.   Cardiovascular: Negative for chest pain.  Gastrointestinal: Positive for nausea, vomiting, abdominal pain and diarrhea.  Genitourinary: Positive for vaginal bleeding. Negative for dysuria.  Musculoskeletal:  Negative for back pain.  Skin: Negative for wound.  Neurological: Positive for headaches.  All other systems reviewed and are negative.     Allergies  Amoxicillin and Amoxapine and related  Home Medications   Prior to Admission medications   Medication Sig Start Date End Date Taking? Authorizing Provider  acetaminophen (TYLENOL) 500 MG tablet Take 500 mg by mouth every 6 (six) hours as needed. For pain/fever    Historical Provider, MD  albuterol (PROVENTIL HFA;VENTOLIN HFA) 108 (90 BASE) MCG/ACT inhaler Inhale 1-2 puffs into the lungs every 6 (six) hours as needed for wheezing or shortness of breath. 06/07/14   Donnetta HutchingBrian Cook, MD  guaiFENesin-dextromethorphan (ROBITUSSIN DM) 100-10 MG/5ML syrup Take 5 mLs by mouth every 4 (four) hours as needed for cough. 06/11/14   Benjiman CoreNathan Pickering, MD  HYDROcodone-acetaminophen (NORCO/VICODIN) 5-325 MG per tablet Take 1 tablet by mouth every 6 (six) hours as needed for moderate pain. 09/25/14   Shon Batonourtney F Arlee Santosuosso, MD  HYDROcodone-homatropine (HYCODAN) 5-1.5 MG/5ML syrup Take 5 mLs by mouth every 6 (six) hours as needed for cough. 06/07/14   Donnetta HutchingBrian Cook, MD  levofloxacin (LEVAQUIN) 500 MG tablet Take 1 tablet (500 mg total) by mouth daily. 06/11/14   Benjiman CoreNathan Pickering, MD  medroxyPROGESTERone (PROVERA) 10 MG tablet Take 1 tablet (10 mg total) by mouth daily. Patient not taking: Reported on 06/07/2014 06/22/13   Donnetta HutchingBrian Cook, MD  oxyCODONE-acetaminophen (PERCOCET) 5-325 MG per tablet Take 1-2 tablets by mouth every 6 (six) hours as needed for moderate pain. 06/11/14   Benjiman CoreNathan Pickering, MD  PE-DM-APAP &  Doxylamin-DM-APAP (VICKS DAYQUIL/NYQUIL CLD & FLU) (LIQUID) MISC Take 15-30 mLs by mouth daily as needed (for cough/cold).    Historical Provider, MD  Phenylephrine-APAP-Guaifenesin [facility] WARMING COLD & CHEST) 5-325-200 MG/15ML LIQD Take 30 mLs by mouth every 4 (four) hours as needed (for cold and cough).    Historical Provider, MD  predniSONE (DELTASONE) 50 MG tablet  One tab daily for 4 days;  1/2 half tab daily for 4days Patient taking differently: Take 25-50 mg by mouth as directed. One tab daily for 4 days;  1/2 half tab daily for 4days 06/07/14   Donnetta Hutching, MD  promethazine (PHENERGAN) 25 MG tablet Take 1 tablet (25 mg total) by mouth every 8 (eight) hours as needed for nausea or vomiting. 09/25/14   Shon Baton, MD   BP 159/102 mmHg  Pulse 104  Temp(Src) 98.2 F (36.8 C) (Oral)  Resp 18  Ht  (1.6 m)  Wt 248 lb (112.492 kg)  BMI 43.94 kg/m2  SpO2 98%  LMP 09/25/2014 Physical Exam  Constitutional: She is oriented to person, place, and time. She appears well-developed and well-nourished.  Obese  HENT:  Head: Normocephalic and atraumatic.  Eyes: Pupils are equal, round, and reactive to light.  Cardiovascular: Normal rate, regular rhythm and normal heart sounds.   No murmur heard. Pulmonary/Chest: Effort normal and breath sounds normal. No respiratory distress. She has no wheezes.  Abdominal: Soft. Bowel sounds are normal. There is no tenderness. There is no rebound.  Neurological: She is alert and oriented to person, place, and time.  Skin: Skin is warm and dry.  Psychiatric: She has a normal mood and affect.  Nursing note and vitals reviewed.   ED Course  Procedures (including critical care time) Labs Review Labs Reviewed  URINALYSIS, ROUTINE W REFLEX MICROSCOPIC - Abnormal; Notable for the following:    Hgb urine dipstick LARGE (*)    Bilirubin Urine SMALL (*)    Ketones, ur 40 (*)    Protein, ur 30 (*)    All other components within normal limits  CBC WITH DIFFERENTIAL/PLATELET - Abnormal; Notable for the following:    WBC 12.8 (*)    Neutrophils Relative % 86 (*)    Neutro Abs 11.1 (*)    Lymphocytes Relative 8 (*)    All other components within normal limits  PREGNANCY, URINE  URINE MICROSCOPIC-ADD ON    Imaging Review No results found.   EKG Interpretation None      MDM   Final diagnoses:  Nausea  vomiting and diarrhea  Dysfunctional uterine bleeding   Patient presents with primary complaint of nausea, vomiting, and diarrhea. Sick contacts. Nontoxic and afebrile on exam. No signs of peritonitis. Abdomen is soft. Suspect gastroenteritis. Patient was given Zofran and Imodium as well as ibuprofen.  CBC obtained to evaluate hematocrit given bleeding. Hematocrit is normal. She does have evidence of mild leukocytosis with a left shift which may represent acute viral illness. Urinalysis shows 40 ketones and evidence of mild dehydration. She is not pregnant. Patient reports continued nausea after Zofran but has not had any emesis. Patient was given Phenergan and Norco. Patient has been able to tolerate fluids. Will discharge home with nausea and pain medication. Patient will be given follow-up information for women's health clinic for follow-up for her dysfunctional uterine bleeding. No evidence of life-threatening bleeding at this time and that does not appear to be directly related to chief complaint.  After history, exam, and medical workup I feel the patient  has been appropriately medically screened and is safe for discharge home. Pertinent diagnoses were discussed with the patient. Patient was given return precautions.   Shon Baton, MD 09/25/14 [phone]

## 2014-09-25 NOTE — Discharge Instructions (Signed)
You were seen today for nausea, vomiting, and diarrhea. Her presentation is most consistent with gastroenteritis especially given your sick contacts. He also noted that you have had abnormal uterine bleeding. Your hemoglobin is normal. You need to follow-up with the women's health clinic for further evaluation for this. You are not pregnant. You will be given pain and nausea medication at discharge.  Viral Gastroenteritis Viral gastroenteritis is also known as stomach flu. This condition affects the stomach and intestinal tract. It can cause sudden diarrhea and vomiting. The illness typically lasts 3 to 8 days. Most people develop an immune response that eventually gets rid of the virus. While this natural response develops, the virus can make you quite ill. CAUSES  Many different viruses can cause gastroenteritis, such as rotavirus or noroviruses. You can catch one of these viruses by consuming contaminated food or water. You may also catch a virus by sharing utensils or other personal items with an infected person or by touching a contaminated surface. SYMPTOMS  The most common symptoms are diarrhea and vomiting. These problems can cause a severe loss of body fluids (dehydration) and a body salt (electrolyte) imbalance. Other symptoms may include:  Fever.  Headache.  Fatigue.  Abdominal pain. DIAGNOSIS  Your caregiver can usually diagnose viral gastroenteritis based on your symptoms and a physical exam. A stool sample may also be taken to test for the presence of viruses or other infections. TREATMENT  This illness typically goes away on its own. Treatments are aimed at rehydration. The most serious cases of viral gastroenteritis involve vomiting so severely that you are not able to keep fluids down. In these cases, fluids must be given through an intravenous line (IV). HOME CARE INSTRUCTIONS   Drink enough fluids to keep your urine clear or pale yellow. Drink small amounts of fluids  frequently and increase the amounts as tolerated.  Ask your caregiver for specific rehydration instructions.  Avoid:  Foods high in sugar.  Alcohol.  Carbonated drinks.  Tobacco.  Juice.  Caffeine drinks.  Extremely hot or cold fluids.  Fatty, greasy foods.  Too much intake of anything at one time.  Dairy products until 24 to 48 hours after diarrhea stops.  You may consume probiotics. Probiotics are active cultures of beneficial bacteria. They may lessen the amount and number of diarrheal stools in adults. Probiotics can be found in yogurt with active cultures and in supplements.  Wash your hands well to avoid spreading the virus.  Only take over-the-counter or prescription medicines for pain, discomfort, or fever as directed by your caregiver. Do not give aspirin to children. Antidiarrheal medicines are not recommended.  Ask your caregiver if you should continue to take your regular prescribed and over-the-counter medicines.  Keep all follow-up appointments as directed by your caregiver. SEEK IMMEDIATE MEDICAL CARE IF:   You are unable to keep fluids down.  You do not urinate at least once every 6 to 8 hours.  You develop shortness of breath.  You notice blood in your stool or vomit. This may look like coffee grounds.  You have abdominal pain that increases or is concentrated in one small area (localized).  You have persistent vomiting or diarrhea.  You have a fever.  The patient is a child younger than 3 months, and he or she has a fever.  The patient is a child older than 3 months, and he or she has a fever and persistent symptoms.  The patient is a child older than 3 months,  and he or she has a fever and symptoms suddenly get worse.  The patient is a baby, and he or she has no tears when crying. MAKE SURE YOU:   Understand these instructions.  Will watch your condition.  Will get help right away if you are not doing well or get worse. Document  Released: 06/21/2005 Document Revised: 09/13/2011 Document Reviewed: 04/07/2011 [facility] Patient Information 2015 [place], [place]. This information is not intended to replace advice given to you by your health care provider. Make sure you discuss any questions you have with your health care provider.  Abnormal Uterine Bleeding Abnormal uterine bleeding means bleeding from the vagina that is not your normal menstrual period. This can be:  Bleeding or spotting between periods.  Bleeding after sex (sexual intercourse).  Bleeding that is heavier or more than normal.  Periods that last longer than usual.  Bleeding after menopause. There are many problems that may cause this. Treatment will depend on the cause of the bleeding. Any kind of bleeding that is not normal should be reviewed by your doctor.  HOME CARE Watch your condition for any changes. These actions may lessen any discomfort you are having:  Do not use tampons or douches as told by your doctor.  Change your pads often. You should get regular pelvic exams and Pap tests. Keep all appointments for tests as told by your doctor. GET HELP IF:  You are bleeding for more than 1 week.  You feel dizzy at times. GET HELP RIGHT AWAY IF:   You pass out.  You have to change pads every 15 to 30 minutes.  You have belly pain.  You have a fever.  You become sweaty or weak.  You are passing large blood clots from the vagina.  You feel sick to your stomach (nauseous) and throw up (vomit). MAKE SURE YOU:  Understand these instructions.  Will watch your condition.  Will get help right away if you are not doing well or get worse. Document Released: 04/18/2009 Document Revised: 06/26/2013 Document Reviewed: 01/18/2013 [facility] Patient Information 2015 [place], [place]. This information is not intended to replace advice given to you by your health care provider. Make sure you discuss any questions you have with your health care  provider.

## 2014-11-28 ENCOUNTER — Other Ambulatory Visit (HOSPITAL_COMMUNITY): Payer: Self-pay | Admitting: Nurse Practitioner

## 2014-11-28 DIAGNOSIS — N921 Excessive and frequent menstruation with irregular cycle: Secondary | ICD-10-CM

## 2014-12-04 ENCOUNTER — Ambulatory Visit (HOSPITAL_COMMUNITY): Admission: RE | Admit: 2014-12-04 | Payer: Self-pay | Source: Ambulatory Visit

## 2014-12-04 ENCOUNTER — Other Ambulatory Visit (HOSPITAL_COMMUNITY): Payer: Self-pay

## 2014-12-06 ENCOUNTER — Ambulatory Visit (HOSPITAL_COMMUNITY)
Admission: RE | Admit: 2014-12-06 | Discharge: 2014-12-06 | Disposition: A | Discharge status: Home / Self Care | Payer: Self-pay | Source: Ambulatory Visit | Attending: Nurse Practitioner | Admitting: Nurse Practitioner

## 2014-12-06 DIAGNOSIS — N926 Irregular menstruation, unspecified: Secondary | ICD-10-CM | POA: Insufficient documentation

## 2014-12-06 DIAGNOSIS — N921 Excessive and frequent menstruation with irregular cycle: Secondary | ICD-10-CM

## 2014-12-06 DIAGNOSIS — N920 Excessive and frequent menstruation with regular cycle: Secondary | ICD-10-CM | POA: Insufficient documentation

## 2014-12-06 DIAGNOSIS — N946 Dysmenorrhea, unspecified: Secondary | ICD-10-CM | POA: Insufficient documentation

## 2017-11-10 ENCOUNTER — Encounter (HOSPITAL_COMMUNITY): Payer: Self-pay | Admitting: Emergency Medicine

## 2017-11-10 ENCOUNTER — Other Ambulatory Visit: Payer: Self-pay

## 2017-11-10 ENCOUNTER — Emergency Department (HOSPITAL_COMMUNITY)
Admission: EM | Admit: 2017-11-10 | Discharge: 2017-11-10 | Disposition: A | Discharge status: Home / Self Care | Payer: Self-pay | Attending: Emergency Medicine | Admitting: Emergency Medicine

## 2017-11-10 DIAGNOSIS — Y939 Activity, unspecified: Secondary | ICD-10-CM | POA: Insufficient documentation

## 2017-11-10 DIAGNOSIS — Y999 Unspecified external cause status: Secondary | ICD-10-CM | POA: Insufficient documentation

## 2017-11-10 DIAGNOSIS — Z79899 Other long term (current) drug therapy: Secondary | ICD-10-CM | POA: Insufficient documentation

## 2017-11-10 DIAGNOSIS — W57XXXA Bitten or stung by nonvenomous insect and other nonvenomous arthropods, initial encounter: Secondary | ICD-10-CM | POA: Insufficient documentation

## 2017-11-10 DIAGNOSIS — Z7722 Contact with and (suspected) exposure to environmental tobacco smoke (acute) (chronic): Secondary | ICD-10-CM | POA: Insufficient documentation

## 2017-11-10 DIAGNOSIS — G43009 Migraine without aura, not intractable, without status migrainosus: Secondary | ICD-10-CM | POA: Insufficient documentation

## 2017-11-10 DIAGNOSIS — Y929 Unspecified place or not applicable: Secondary | ICD-10-CM | POA: Insufficient documentation

## 2017-11-10 DIAGNOSIS — S20161A Insect bite (nonvenomous) of breast, right breast, initial encounter: Secondary | ICD-10-CM | POA: Insufficient documentation

## 2017-11-10 LAB — COMPREHENSIVE METABOLIC PANEL
ALBUMIN: 4.1 g/dL (ref 3.5–5.0)
ALT: 47 U/L (ref 14–54)
AST: 32 U/L (ref 15–41)
Alkaline Phosphatase: 52 U/L (ref 38–126)
Anion gap: 6 (ref 5–15)
BUN: 7 mg/dL (ref 6–20)
CHLORIDE: 102 mmol/L (ref 101–111)
CO2: 30 mmol/L (ref 22–32)
CREATININE: 0.65 mg/dL (ref 0.44–1.00)
Calcium: 10 mg/dL (ref 8.9–10.3)
GFR calc Af Amer: >60 mL/min (ref >60)
GFR calc non Af Amer: >60 mL/min (ref >60)
GLUCOSE: 91 mg/dL (ref 65–99)
POTASSIUM: 3.9 mmol/L (ref 3.5–5.1)
Sodium: 138 mmol/L (ref 135–145)
Total Bilirubin: 0.5 mg/dL (ref 0.3–1.2)
Total Protein: 7.7 g/dL (ref 6.5–8.1)

## 2017-11-10 LAB — CBC WITH DIFFERENTIAL/PLATELET
BASOS PCT: 0 %
Basophils Absolute: 0 10*3/uL (ref 0.0–0.1)
Eosinophils Absolute: 0.3 10*3/uL (ref 0.0–0.7)
Eosinophils Relative: 4 %
HCT: 42.6 % (ref 36.0–46.0)
Hemoglobin: 14 g/dL (ref 12.0–15.0)
LYMPHS PCT: 26 %
Lymphs Abs: 2.1 10*3/uL (ref 0.7–4.0)
MCH: 30.4 pg (ref 26.0–34.0)
MCHC: 32.9 g/dL (ref 30.0–36.0)
MCV: 92.6 fL (ref 78.0–100.0)
MONO ABS: 0.5 10*3/uL (ref 0.1–1.0)
Monocytes Relative: 6 %
Neutro Abs: 5.2 10*3/uL (ref 1.7–7.7)
Neutrophils Relative %: 64 %
Platelets: 310 10*3/uL (ref 150–400)
RBC: 4.6 MIL/uL (ref 3.87–5.11)
RDW: 13.2 % (ref 11.5–15.5)
WBC: 8.1 10*3/uL (ref 4.0–10.5)

## 2017-11-10 LAB — I-STAT BETA HCG BLOOD, ED (MC, WL, AP ONLY): I-stat hCG, quantitative: <5 m[IU]/mL (ref <5)

## 2017-11-10 MED ORDER — PROCHLORPERAZINE EDISYLATE 10 MG/2ML IJ SOLN
10.0000 mg | Freq: Once | INTRAMUSCULAR | Status: AC
  Administered 2017-11-10: 10 mg via INTRAMUSCULAR
  Filled 2017-11-10: qty 2

## 2017-11-10 MED ORDER — DIPHENHYDRAMINE HCL 25 MG PO CAPS
25.0000 mg | ORAL_CAPSULE | Freq: Once | ORAL | Status: AC
  Administered 2017-11-10: 25 mg via ORAL
  Filled 2017-11-10: qty 1

## 2017-11-10 MED ORDER — KETOROLAC TROMETHAMINE 30 MG/ML IJ SOLN
30.0000 mg | Freq: Once | INTRAMUSCULAR | Status: AC
  Administered 2017-11-10: 30 mg via INTRAMUSCULAR
  Filled 2017-11-10: qty 1

## 2017-11-10 MED ORDER — DOXYCYCLINE HYCLATE 100 MG PO CAPS: 100.0000 mg | ORAL_CAPSULE | Freq: Two times a day (BID) | ORAL | 0 refills | Status: DC

## 2017-11-10 MED ORDER — DOXYCYCLINE HYCLATE 100 MG PO TABS
100.0000 mg | ORAL_TABLET | Freq: Once | ORAL | Status: AC
  Administered 2017-11-10: 100 mg via ORAL
  Filled 2017-11-10: qty 1

## 2017-11-10 NOTE — ED Triage Notes (Signed)
Pt was bit by a tick on her right breast.  Reddened and swollen area , S/s of sweating, headache with nausea.

## 2017-11-10 NOTE — Discharge Instructions (Addendum)
Take the entire course of the antibiotics prescribed.  Apply warm compresses to the site as discussed several times daily.  Get rechecked within the next 2 days for any significantly worsening redness or if it is not responding to the antibiotics within the next 48 hours.

## 2017-11-11 NOTE — ED Provider Notes (Signed)
[facility] EMERGENCY DEPARTMENT Provider Note   CSN: [id number] Arrival date & time: 11/10/17  1248     History   Chief Complaint Chief Complaint  Patient presents with  . Insect Bite    HPI Autumn Boyd is a 32 y.o. female with a history of migraine headaches presenting with headache but also reports tick bite which she discovered on her breast 2 days ago and since has had increased redness and pain at the site.  She denies fevers or chills and denies rash.  She describes a constant aching headache on her right parietal area radiating to her left frontal area which is been constant for the past day.  She denies vomiting, neck pain or stiffness but does endorse nausea.  She has some mild photophobia but denies other vision changes.  Her headache is not consistent with prior migraines as she had no prodromal scotoma and her migraines are generally one-sided.  She denies focal weakness, dizziness.  She also denies chest pain, shortness of breath, abdominal pain and has noted no rashes sore throat or other pain complaints.  She has had no medications prior to arrival for her symptoms.  The history is provided by the patient.    Past Medical History:  Diagnosis Date  . Migraines     There are no active problems to display for this patient.   History reviewed. No pertinent surgical history.   OB History    Gravida  1   Para      Term      Preterm      AB  1   Living        SAB  1   TAB      Ectopic      Multiple      Live Births               Home Medications    Prior to Admission medications   Medication Sig Start Date End Date Taking? Authorizing Provider  acetaminophen (TYLENOL) 500 MG tablet Take 500 mg by mouth every 6 (six) hours as needed. For pain/fever    [provider]  albuterol (PROVENTIL HFA;VENTOLIN HFA) 108 (90 BASE) MCG/ACT inhaler Inhale 1-2 puffs into the lungs every 6 (six) hours as needed for wheezing or shortness of  breath. 06/07/14   Donnetta Hutching, MD  doxycycline (VIBRAMYCIN) 100 MG capsule Take 1 capsule (100 mg total) by mouth 2 (two) times daily. 11/10/17   Julanne Schlueter, Raynelle Fanning, PA-C  guaiFENesin-dextromethorphan (ROBITUSSIN DM) 100-10 MG/5ML syrup Take 5 mLs by mouth every 4 (four) hours as needed for cough. 06/11/14   Benjiman Core, MD  HYDROcodone-acetaminophen (NORCO/VICODIN) 5-325 MG per tablet Take 1 tablet by mouth every 6 (six) hours as needed for moderate pain. 09/25/14   Horton, Mayer Masker, MD  HYDROcodone-homatropine (HYCODAN) 5-1.5 MG/5ML syrup Take 5 mLs by mouth every 6 (six) hours as needed for cough. 06/07/14   Donnetta Hutching, MD  levofloxacin (LEVAQUIN) 500 MG tablet Take 1 tablet (500 mg total) by mouth daily. 06/11/14   Benjiman Core, MD  medroxyPROGESTERone (PROVERA) 10 MG tablet Take 1 tablet (10 mg total) by mouth daily. Patient not taking: Reported on 06/07/2014 06/22/13   Donnetta Hutching, MD  oxyCODONE-acetaminophen (PERCOCET) 5-325 MG per tablet Take 1-2 tablets by mouth every 6 (six) hours as needed for moderate pain. 06/11/14   Benjiman Core, MD  PE-DM-APAP & Doxylamin-DM-APAP (VICKS DAYQUIL/NYQUIL CLD & FLU) (LIQUID) MISC Take 15-30 mLs by mouth daily  as needed (for cough/cold).    [provider]  Phenylephrine-APAP-Guaifenesin [facility] WARMING COLD & CHEST) 5-325-200 MG/15ML LIQD Take 30 mLs by mouth every 4 (four) hours as needed (for cold and cough).    [provider]  predniSONE (DELTASONE) 50 MG tablet One tab daily for 4 days;  1/2 half tab daily for 4days Patient taking differently: Take 25-50 mg by mouth as directed. One tab daily for 4 days;  1/2 half tab daily for 4days 06/07/14   Donnetta Hutching, MD  promethazine (PHENERGAN) 25 MG tablet Take 1 tablet (25 mg total) by mouth every 8 (eight) hours as needed for nausea or vomiting. 09/25/14   Horton, Mayer Masker, MD    Family History Family History  Problem Relation Age of Onset  . Hypertension Father   . Diabetes  Father   . Diabetes Mother   . Cancer Maternal Grandfather        mouth    Social History Social History   Tobacco Use  . Smoking status: Passive Smoke Exposure - Never Smoker  . Smokeless tobacco: Never Used  Substance Use Topics  . Alcohol use: No  . Drug use: No     Allergies   Amoxicillin and Amoxapine and related   Review of Systems Review of Systems  Constitutional: Negative for fever.  HENT: Negative.  Negative for congestion and sore throat.   Eyes: Positive for photophobia.  Respiratory: Negative for chest tightness and shortness of breath.   Cardiovascular: Negative for chest pain.  Gastrointestinal: Positive for nausea. Negative for abdominal pain.  Genitourinary: Negative.   Musculoskeletal: Negative for arthralgias, joint swelling, myalgias and neck pain.  Skin: Positive for color change. Negative for rash and wound.  Neurological: Positive for headaches. Negative for dizziness, weakness, light-headedness and numbness.  Hematological: Negative for adenopathy.  Psychiatric/Behavioral: Negative.      Physical Exam Updated Vital Signs BP (!) 174/96 (BP Location: Right Arm)   Pulse 84   Temp 98.4 F (36.9 C) (Oral)   Resp 18   Ht  (1.651 m)   Wt 117.9 kg (260 lb)   SpO2 99%   BMI 43.27 kg/m   Physical Exam  Constitutional: She is oriented to person, place, and time. She appears well-developed and well-nourished.  Uncomfortable appearing  HENT:  Head: Normocephalic and atraumatic.  Mouth/Throat: Oropharynx is clear and moist.  Eyes: Pupils are equal, round, and reactive to light. Conjunctivae and EOM are normal.  Neck: Normal range of motion. Neck supple.  Cardiovascular: Normal rate, regular rhythm, normal heart sounds and intact distal pulses.  Pulmonary/Chest: Effort normal and breath sounds normal. She has no wheezes.  Abdominal: Soft. Bowel sounds are normal. There is no tenderness.  Musculoskeletal: Normal range of motion.    Lymphadenopathy:    She has no cervical adenopathy.  Neurological: She is alert and oriented to person, place, and time. She has normal strength. No sensory deficit. Gait normal. GCS eye subscore is 4. GCS verbal subscore is 5. GCS motor subscore is 6.  Normal heel-shin, normal rapid alternating movements. Cranial nerves III-XII intact.  No pronator drift.  Skin: Skin is warm and dry. No rash noted. There is erythema.  4 cm area of erythema at the patient's right lateral breast.  Small central bite site.  There is no induration or fluctuance present.  No red streaking.  She has no axillary adenopathy.  Psychiatric: She has a normal mood and affect. Her speech is normal and behavior is  normal. Thought content normal. Cognition and memory are normal.  Nursing note and vitals reviewed.    ED Treatments / Results  Labs (all labs ordered are listed, but only abnormal results are displayed) Labs Reviewed  CBC WITH DIFFERENTIAL/PLATELET  COMPREHENSIVE METABOLIC PANEL  I-STAT BETA HCG BLOOD, ED ([facility], [facility], [facility] ONLY)  I-STAT BETA HCG BLOOD, ED ([facility], [facility], [facility] ONLY)    EKG None  Radiology No results found.  Procedures Procedures (including critical care time)  Medications Ordered in ED Medications  prochlorperazine (COMPAZINE) injection 10 mg (10 mg Intramuscular Given 11/10/17 1654)  diphenhydrAMINE (BENADRYL) capsule 25 mg (25 mg Oral Given 11/10/17 1655)  ketorolac (TORADOL) 30 MG/ML injection 30 mg (30 mg Intramuscular Given 11/10/17 1655)  doxycycline (VIBRA-TABS) tablet 100 mg (100 mg Oral Given 11/10/17 1806)     Initial Impression / Assessment and Plan / ED Course  I have reviewed the triage vital signs and the nursing notes.  Pertinent labs & imaging results that were available during my care of the patient were reviewed by me and considered in my medical decision making (see chart for details).     Patient was given migraine cocktail including Compazine, Benadryl and Toradol and she  had improving headache at time of discharge.  Site of erythema at her right breast was marked with a skin marker pen.  I suspect this may be a localized inflammatory reaction, however will cover her for possible tickborne infection, especially given the atypical headache presentation.  She was started on doxycycline.  Advised warm compresses to the breast and close follow-up with her PCP or returning here if symptoms persist or worsen.  Patient understands and agrees with this plan.  Final Clinical Impressions(s) / ED Diagnoses   Final diagnoses:  Tick bite, initial encounter  Migraine without aura and without status migrainosus, not intractable    ED Discharge Orders        Ordered    doxycycline (VIBRAMYCIN) 100 MG capsule  2 times daily     11/10/17 1725       Burgess Amor, PA-C 11/11/17 1653    Bethann Berkshire, MD 11/14/17 [phone]

## 2018-05-20 ENCOUNTER — Encounter (HOSPITAL_COMMUNITY): Payer: Self-pay | Admitting: Emergency Medicine

## 2018-05-20 ENCOUNTER — Other Ambulatory Visit: Payer: Self-pay

## 2018-05-20 ENCOUNTER — Emergency Department (HOSPITAL_COMMUNITY)
Admission: EM | Admit: 2018-05-20 | Discharge: 2018-05-20 | Disposition: A | Discharge status: Home / Self Care | Payer: Self-pay | Attending: Emergency Medicine | Admitting: Emergency Medicine

## 2018-05-20 DIAGNOSIS — J019 Acute sinusitis, unspecified: Secondary | ICD-10-CM | POA: Insufficient documentation

## 2018-05-20 DIAGNOSIS — Z79899 Other long term (current) drug therapy: Secondary | ICD-10-CM | POA: Insufficient documentation

## 2018-05-20 DIAGNOSIS — J329 Chronic sinusitis, unspecified: Secondary | ICD-10-CM

## 2018-05-20 DIAGNOSIS — R070 Pain in throat: Secondary | ICD-10-CM | POA: Insufficient documentation

## 2018-05-20 DIAGNOSIS — R05 Cough: Secondary | ICD-10-CM | POA: Insufficient documentation

## 2018-05-20 DIAGNOSIS — Z7722 Contact with and (suspected) exposure to environmental tobacco smoke (acute) (chronic): Secondary | ICD-10-CM | POA: Insufficient documentation

## 2018-05-20 MED ORDER — CLARITHROMYCIN 500 MG PO TABS: 500.0000 mg | ORAL_TABLET | Freq: Two times a day (BID) | ORAL | 0 refills | Status: DC

## 2018-05-20 NOTE — ED Triage Notes (Addendum)
Patient complaining of cough and congestion x 1 week and bilateral ear pain x 4 days. After triage, patient complains of 3 nose bleeds and some dizziness starting Wednesday when ear pain began.

## 2018-05-20 NOTE — Discharge Instructions (Addendum)
Make sure that you are getting plenty of rest, drinking a lot of fluids.  Use Tylenol or Motrin for fever or achiness.  We sent a prescription to your pharmacy, for antibiotic to use to treat the infection.

## 2018-05-20 NOTE — ED Provider Notes (Signed)
[facility] EMERGENCY DEPARTMENT Provider Note   CSN: [id number] Arrival date & time: 05/20/18  1346     History   Chief Complaint Chief Complaint  Patient presents with  . Otalgia    HPI Autumn Boyd is a 33 y.o. female.  HPI   She presents for evaluation of nasal congestion, ear pain, sore throat, cough, chills, and achiness present for 1 week.  Symptoms worsening despite treating with over-the-counter medications for cold.  History of recurrent problems this time of the year usually.  There are no other known modifying factors.  Past Medical History:  Diagnosis Date  . Migraines     There are no active problems to display for this patient.   History reviewed. No pertinent surgical history.   OB History    Gravida  1   Para      Term      Preterm      AB  1   Living        SAB  1   TAB      Ectopic      Multiple      Live Births               Home Medications    Prior to Admission medications   Medication Sig Start Date End Date Taking? Authorizing Provider  acetaminophen (TYLENOL) 500 MG tablet Take 500 mg by mouth every 6 (six) hours as needed. For pain/fever   Yes [provider]  amitriptyline (ELAVIL) 50 MG tablet TAKE 1 TABLET BY MOUTH ONCE DAILY AT BEDTIME TO PREVENT MIGRAINES 03/14/18  Yes [provider]  BIOTIN FORTE PO Take 2 tablets by mouth daily.   Yes [provider]  FLUoxetine (PROZAC) 20 MG capsule TAKE 1 CAPSULE BY MOUTH ONCE DAILY IN THE EVENING FOR 30 DAYS 04/26/18  Yes [provider]  folic acid (FOLVITE) 400 MCG tablet Take 800 mcg by mouth daily.   Yes [provider]  loratadine (CLARITIN) 10 MG tablet Take 10 mg by mouth daily. 02/12/18  Yes [provider]  lovastatin (MEVACOR) 20 MG tablet TAKE 1 & 1 2 (ONE & ONE HALF) TABLETS BY MOUTH ONCE DAILY WITH EVENING MEAL FOR 30 DAYS 03/28/18  Yes [provider]  MELATONIN PO Take 1 tablet by mouth at  bedtime.   Yes [provider]  metFORMIN (GLUCOPHAGE) 500 MG tablet TAKE 1 TABLET BY MOUTH TWICE DAILY WITH MORNING MEAL AND WITH EVENING MEAL FOR 30 DAYS 05/08/18  Yes [provider]  NIACIN PO Take 1 tablet by mouth daily.   Yes [provider]  Omega-3 Fatty Acids (FISH OIL PO) Take 1 capsule by mouth daily.   Yes [provider]  propranolol (INDERAL) 20 MG tablet TAKE 1 TABLET BY MOUTH TWICE DAILY FOR 30 DAYS 03/28/18  Yes [provider]  clarithromycin (BIAXIN) 500 MG tablet Take 1 tablet (500 mg total) by mouth 2 (two) times daily. 05/20/18   Mancel Bale, MD    Family History Family History  Problem Relation Age of Onset  . Hypertension Father   . Diabetes Father   . Diabetes Mother   . Cancer Maternal Grandfather        mouth    Social History Social History   Tobacco Use  . Smoking status: Passive Smoke Exposure - Never Smoker  . Smokeless tobacco: Never Used  Substance Use Topics  . Alcohol use: Yes    Comment: occasionally  .  Drug use: No     Allergies   Amoxicillin and Amoxapine and related   Review of Systems Review of Systems  All other systems reviewed and are negative.    Physical Exam Updated Vital Signs BP 139/77 (BP Location: Right Arm)   Pulse 73   Temp 97.9 F (36.6 C) (Oral)   Resp 20   Ht 5\' 5"  (1.651 m)   Wt 118.4 kg   LMP 04/30/2018   SpO2 100%   BMI 43.43 kg/m   Physical Exam  Constitutional: She is oriented to person, place, and time. She appears well-developed and well-nourished. No distress.  HENT:  Head: Normocephalic and atraumatic.  Eyes: Pupils are equal, round, and reactive to light. Conjunctivae and EOM are normal.  Neck: Normal range of motion and phonation normal. Neck supple.  Cardiovascular: Normal rate and regular rhythm.  Pulmonary/Chest: Effort normal and breath sounds normal. No stridor. No respiratory distress. She has no wheezes. She has no rales. She exhibits no  tenderness.  Abdominal: Soft. She exhibits no distension. There is no tenderness. There is no guarding.  Musculoskeletal: Normal range of motion.  Neurological: She is alert and oriented to person, place, and time. She exhibits normal muscle tone.  Skin: Skin is warm and dry.  Psychiatric: She has a normal mood and affect. Her behavior is normal. Judgment and thought content normal.  Nursing note and vitals reviewed.    ED Treatments / Results  Labs (all labs ordered are listed, but only abnormal results are displayed) Labs Reviewed - No data to display  EKG None  Radiology No results found.  Procedures Procedures (including critical care time)  Medications Ordered in ED Medications - No data to display   Initial Impression / Assessment and Plan / ED Course  I have reviewed the triage vital signs and the nursing notes.  Pertinent labs & imaging results that were available during my care of the patient were reviewed by me and considered in my medical decision making (see chart for details).      Patient Vitals for the past 24 hrs:  BP Temp Temp src Pulse Resp SpO2 Height Weight  05/20/18 1350 139/77 97.9 F (36.6 C) Oral 73 20 100 % 5\' 5"  (1.651 m) 118.4 kg    2:49 PM Reevaluation with update and discussion. After initial assessment and treatment, an updated evaluation reveals no change in clinical status, findings discussed with the patient and all questions were answered. Mancel Bale   Medical Decision Making: URI symptoms possibly secondary pneumonia.  Sinusitis, possibly bacterial.  Doubt serious bacterial infection, metabolic instability or impending vascular collapse.  CRITICAL CARE-no Performed by: Mancel Bale  Nursing Notes Reviewed/ Care Coordinated Applicable Imaging Reviewed Interpretation of Laboratory Data incorporated into ED treatment  The patient appears reasonably screened and/or stabilized for discharge and I doubt any other medical condition  or other [facility] requiring further screening, evaluation, or treatment in the ED at this time prior to discharge.  Plan: Home Medications-OTC analgesia PRN; Home Treatments-rest, fluids; return here if the recommended treatment, does not improve the symptoms; Recommended follow up-PCP, PRN.  Work release for 3 days.   Final Clinical Impressions(s) / ED Diagnoses   Final diagnoses:  Sinusitis, unspecified chronicity, unspecified location    ED Discharge Orders         Ordered    clarithromycin (BIAXIN) 500 MG tablet  2 times daily     05/20/18 1451  Mancel BaleWentz, Tyner Codner, MD 05/20/18 [phone]

## 2018-11-22 ENCOUNTER — Other Ambulatory Visit: Payer: Self-pay

## 2018-11-22 ENCOUNTER — Emergency Department (HOSPITAL_COMMUNITY)
Admission: EM | Admit: 2018-11-22 | Discharge: 2018-11-22 | Disposition: A | Discharge status: Home / Self Care | Payer: Self-pay | Attending: Emergency Medicine | Admitting: Emergency Medicine

## 2018-11-22 ENCOUNTER — Emergency Department (HOSPITAL_COMMUNITY): Payer: Self-pay

## 2018-11-22 DIAGNOSIS — Z7722 Contact with and (suspected) exposure to environmental tobacco smoke (acute) (chronic): Secondary | ICD-10-CM | POA: Insufficient documentation

## 2018-11-22 DIAGNOSIS — Z7984 Long term (current) use of oral hypoglycemic drugs: Secondary | ICD-10-CM | POA: Insufficient documentation

## 2018-11-22 DIAGNOSIS — W19XXXA Unspecified fall, initial encounter: Secondary | ICD-10-CM

## 2018-11-22 DIAGNOSIS — S52514A Nondisplaced fracture of right radial styloid process, initial encounter for closed fracture: Secondary | ICD-10-CM | POA: Insufficient documentation

## 2018-11-22 DIAGNOSIS — E119 Type 2 diabetes mellitus without complications: Secondary | ICD-10-CM | POA: Insufficient documentation

## 2018-11-22 DIAGNOSIS — W010XXA Fall on same level from slipping, tripping and stumbling without subsequent striking against object, initial encounter: Secondary | ICD-10-CM | POA: Insufficient documentation

## 2018-11-22 DIAGNOSIS — Y929 Unspecified place or not applicable: Secondary | ICD-10-CM | POA: Insufficient documentation

## 2018-11-22 DIAGNOSIS — Y9302 Activity, running: Secondary | ICD-10-CM | POA: Insufficient documentation

## 2018-11-22 DIAGNOSIS — Y999 Unspecified external cause status: Secondary | ICD-10-CM | POA: Insufficient documentation

## 2018-11-22 DIAGNOSIS — Z79899 Other long term (current) drug therapy: Secondary | ICD-10-CM | POA: Insufficient documentation

## 2018-11-22 MED ORDER — HYDROCODONE-ACETAMINOPHEN 5-325 MG PO TABS
1.0000 | ORAL_TABLET | Freq: Once | ORAL | Status: AC
  Administered 2018-11-22: 1 via ORAL
  Filled 2018-11-22: qty 1

## 2018-11-22 MED ORDER — HYDROCODONE-ACETAMINOPHEN 5-325 MG PO TABS: ORAL_TABLET | ORAL | 0 refills | Status: DC

## 2018-11-22 MED ORDER — OXYCODONE-ACETAMINOPHEN 5-325 MG PO TABS: ORAL_TABLET | ORAL | 0 refills | Status: DC

## 2018-11-22 MED ORDER — BACITRACIN ZINC 500 UNIT/GM EX OINT
TOPICAL_OINTMENT | CUTANEOUS | Status: AC
  Administered 2018-11-22: 1
  Filled 2018-11-22: qty 0.9

## 2018-11-22 NOTE — ED Triage Notes (Signed)
Pt running this morning trying not to get wet and fell.  Reports landed on r arm and flipped.  Pt has abrasion and swelling to r arm.

## 2018-11-22 NOTE — ED Notes (Signed)
Swelling noted to r elbow.  Radial pulse present. Cap refill wnl.

## 2018-11-22 NOTE — ED Provider Notes (Signed)
[facility] EMERGENCY DEPARTMENT Provider Note   CSN: [id number] Arrival date & time: 11/22/18  7893    History   Chief Complaint Chief Complaint  Patient presents with   Fall    HPI Autumn Boyd is a 33 y.o. female.     HPI  Pt was seen at 0800. Pt pt, c/o sudden onset and resolution of one episode of slip and fall that occurred PTA. Pt states she was running in the rain when she slipped and fell onto her right lateral forearm. Pt c/o right forearm "pain." Denies hitting head, no LOC, no neck or back pain, no CP/SOB, no abd pain, no focal motor weakness, no tingling/numbness in extremities.   Td UTD Past Medical History:  Diagnosis Date   Migraines     There are no active problems to display for this patient.   No past surgical history on file.   OB History    Gravida  1   Para      Term      Preterm      AB  1   Living        SAB  1   TAB      Ectopic      Multiple      Live Births               Home Medications    Prior to Admission medications   Medication Sig Start Date End Date Taking? Authorizing Provider  acetaminophen (TYLENOL) 500 MG tablet Take 500 mg by mouth every 6 (six) hours as needed. For pain/fever    [provider]  amitriptyline (ELAVIL) 50 MG tablet TAKE 1 TABLET BY MOUTH ONCE DAILY AT BEDTIME TO PREVENT MIGRAINES 03/14/18   [provider]  BIOTIN FORTE PO Take 2 tablets by mouth daily.    [provider]  clarithromycin (BIAXIN) 500 MG tablet Take 1 tablet (500 mg total) by mouth 2 (two) times daily. 05/20/18   Mancel Bale, MD  FLUoxetine (PROZAC) 20 MG capsule TAKE 1 CAPSULE BY MOUTH ONCE DAILY IN THE EVENING FOR 30 DAYS 04/26/18   [provider]  folic acid (FOLVITE) 400 MCG tablet Take 800 mcg by mouth daily.    [provider]  loratadine (CLARITIN) 10 MG tablet Take 10 mg by mouth daily. 02/12/18   [provider]  lovastatin (MEVACOR) 20 MG tablet  TAKE 1 & 1 2 (ONE & ONE HALF) TABLETS BY MOUTH ONCE DAILY WITH EVENING MEAL FOR 30 DAYS 03/28/18   [provider]  MELATONIN PO Take 1 tablet by mouth at bedtime.    [provider]  metFORMIN (GLUCOPHAGE) 500 MG tablet TAKE 1 TABLET BY MOUTH TWICE DAILY WITH MORNING MEAL AND WITH EVENING MEAL FOR 30 DAYS 05/08/18   [provider]  NIACIN PO Take 1 tablet by mouth daily.    [provider]  Omega-3 Fatty Acids (FISH OIL PO) Take 1 capsule by mouth daily.    [provider]  propranolol (INDERAL) 20 MG tablet TAKE 1 TABLET BY MOUTH TWICE DAILY FOR 30 DAYS 03/28/18   [provider]    Family History Family History  Problem Relation Age of Onset   Hypertension Father    Diabetes Father    Diabetes Mother    Cancer Maternal Grandfather        mouth    Social History Social History   Tobacco Use   Smoking status: Passive Smoke  Exposure - Never Smoker   Smokeless tobacco: Never Used  Substance Use Topics   Alcohol use: Yes    Comment: occasionally   Drug use: No     Allergies   Amoxicillin and Amoxapine and related   Review of Systems Review of Systems ROS: Statement: All systems negative except as marked or noted in the HPI; Constitutional: Negative for fever and chills. ; ; Eyes: Negative for eye pain, redness and discharge. ; ; ENMT: Negative for ear pain, hoarseness, nasal congestion, sinus pressure and sore throat. ; ; Cardiovascular: Negative for chest pain, palpitations, diaphoresis, dyspnea and peripheral edema. ; ; Respiratory: Negative for cough, wheezing and stridor. ; ; Gastrointestinal: Negative for nausea, vomiting, diarrhea, abdominal pain, blood in stool, hematemesis, jaundice and rectal bleeding. . ; ; Genitourinary: Negative for dysuria, flank pain and hematuria. ; ; Musculoskeletal: +right wrist, forearm pain. Negative for back pain and neck pain. Negative for deformity.; ; Skin: +abrasions. Negative for  pruritus, rash, blisters, bruising and skin lesion.; ; Neuro: Negative for headache, lightheadedness and neck stiffness. Negative for weakness, altered level of consciousness, altered mental status, extremity weakness, paresthesias, involuntary movement, seizure and syncope.       Physical Exam Updated Vital Signs BP 130/74 (BP Location: Left Arm)    Pulse 88    Temp 98.4 F (36.9 C) (Oral)    Resp (!) 22    Ht 5\' 5"  (1.651 m)    Wt 112.5 kg    LMP 11/03/2018    SpO2 96%    BMI 41.27 kg/m   Physical Exam 0805: Physical examination:  Nursing notes reviewed; Vital signs and O2 SAT reviewed;  Constitutional: Well developed, Well nourished, Well hydrated, In no acute distress; Head:  Normocephalic, atraumatic; Eyes: EOMI, PERRL, No scleral icterus; ENMT: Mouth and pharynx normal, Mucous membranes moist; Neck: Supple, Full range of motion, No lymphadenopathy; Cardiovascular: Regular rate and rhythm, No gallop; Respiratory: Breath sounds clear & equal bilaterally, No wheezes.  Speaking full sentences with ease, Normal respiratory effort/excursion; Chest: Nontender, Movement normal; Abdomen: Soft, Nontender, Nondistended, Normal bowel sounds; Genitourinary: No CVA tenderness; Extremities: Peripheral pulses normal, +several very superficial abrasions with localized edema right proximal lateral forearm without deformity, TTP right proximal ulnar area. NT right shoulder/hand/fingers. +TTP right distal radial area, no abrasions, no deformity. No specific snuffbox tenderness.  No pain to axial thumb or 3rd MCP loading. Right forearm compartments soft, strong radial pp, brisk cap refill in fingers. Right hand NMS intact.  Decreased ROM F/E right wrist and elbow d/t c/o pain.   No deformity. No calf tenderness, edema or asymmetry.; Neuro: AA&Ox3, Major CN grossly intact.  Speech clear. No gross focal motor or sensory deficits in extremities.; Skin: Color normal, Warm, Dry.   ED Treatments / Results  Labs (all  labs ordered are listed, but only abnormal results are displayed)   EKG None  Radiology   Procedures Procedures (including critical care time)  Medications Ordered in ED Medications  HYDROcodone-acetaminophen (NORCO/VICODIN) 5-325 MG per tablet 1 tablet (has no administration in time range)     Initial Impression / Assessment and Plan / ED Course  I have reviewed the triage vital signs and the nursing notes.  Pertinent labs & imaging results that were available during my care of the patient were reviewed by me and considered in my medical decision making (see chart for details).     MDM Reviewed: previous chart, nursing note and vitals Interpretation: x-ray   Dg Shoulder Right  Result Date: 11/22/2018 CLINICAL DATA:  Pain following fall EXAM: RIGHT SHOULDER - 2+ VIEW COMPARISON:  None. FINDINGS: Frontal, Y scapular, axillary images were obtained. There is no appreciable fracture or dislocation. Joint spaces appear unremarkable. No erosive change. Visualized right lung clear. IMPRESSION: No fracture or dislocation.  No evident arthropathy. Electronically Signed   By: Bretta Bang III M.D.   On: 11/22/2018 08:15   Dg Elbow Complete Right Result Date: 11/22/2018 CLINICAL DATA:  Pain following fall EXAM: RIGHT ELBOW - COMPLETE 3+ VIEW COMPARISON:  None. FINDINGS: Frontal, lateral, and bilateral oblique views were obtained. No fracture or dislocation. No joint effusion. Joint spaces appear normal. No erosive change. IMPRESSION: No fracture or dislocation.  No evident arthropathy. Electronically Signed   By: Bretta Bang III M.D.   On: 11/22/2018 08:16   Dg Wrist Complete Right Result Date: 11/22/2018 CLINICAL DATA:  Pain following fall EXAM: RIGHT WRIST - COMPLETE 3+ VIEW COMPARISON:  None. FINDINGS: Frontal, oblique, lateral, and ulnar deviation scaphoid images were obtained. There is a subtle lucency, obliquely oriented, along the base of the radial styloid. A nondisplaced  fracture in this area must be of concern. No other findings suggesting potential fracture. No dislocation. Joint spaces appear normal. No erosive change. IMPRESSION: Subtle obliquely oriented lucency at the base of the radial styloid. A nondisplaced fracture in this area must be of concern. No other findings suggesting potential fracture elsewhere. No dislocation. No appreciable arthropathy. Electronically Signed   By: Bretta Bang III M.D.   On: 11/22/2018 08:18     Autumn Boyd was evaluated in Emergency Department on 11/22/2018 for the symptoms described in the history of present illness. She was evaluated in the context of the global COVID-19 pandemic, which necessitated consideration that the patient might be at risk for infection with the SARS-CoV-2 virus that causes COVID-19. Institutional protocols and algorithms that pertain to the evaluation of patients at risk for COVID-19 are in a state of rapid change based on information released by regulatory bodies including the CDC and federal and state organizations. These policies and algorithms were followed during the patient's care in the ED.    [phone]:  XR as above. Will splint/sling, f/u Ortho MD. Dx and testing, d/w pt.  Questions answered.  Verb understanding, agreeable to d/c home with outpt f/u.      Final Clinical Impressions(s) / ED Diagnoses   Final diagnoses:  None    ED Discharge Orders    None       Samuel Jester, DO 11/25/18 1635

## 2018-11-22 NOTE — ED Notes (Signed)
Ice pack given

## 2018-11-22 NOTE — Discharge Instructions (Signed)
Take the prescription as directed.  Apply ice to the area(s) of discomfort, for 15 minutes at a time, several times per day for the next few days.  Do not fall asleep on an ice pack.  Wear the splint and sling until you are seen in follow up. Call the Orthopedic doctor today to schedule a follow up appointment within the next week.  Return to the Emergency Department immediately if worsening.

## 2018-11-22 NOTE — ED Notes (Signed)
Pt refused ice pack, says she can't stand anything to touch her arm.  Pt going to x ray.

## 2018-11-24 ENCOUNTER — Encounter: Payer: Self-pay | Admitting: Orthopedic Surgery

## 2018-11-24 ENCOUNTER — Ambulatory Visit: Payer: Self-pay | Admitting: Orthopedic Surgery

## 2018-11-24 ENCOUNTER — Other Ambulatory Visit: Payer: Self-pay

## 2018-11-24 VITALS — BP 158/95 | HR 75 | Temp 97.6°F | Ht 65.0 in | Wt 269.0 lb

## 2018-11-24 DIAGNOSIS — S52551A Other extraarticular fracture of lower end of right radius, initial encounter for closed fracture: Secondary | ICD-10-CM

## 2018-11-24 DIAGNOSIS — S5001XA Contusion of right elbow, initial encounter: Secondary | ICD-10-CM

## 2018-11-24 NOTE — Patient Instructions (Addendum)
Work note:  Return to work tues Psychologist, clinical only x 2 weeks  Return here 2 weeks    3 x a day 15 elbow exercises   Wear sling and brace all the time (its ok try sleeping without the sling )

## 2018-11-24 NOTE — Progress Notes (Signed)
Progress Note   Patient ID: Autumn Boyd, female   DOB: 1986/02/20, 33 y.o.   MRN: [id number]   Chief Complaint  Patient presents with  . Fracture    Rt wrist DOI 11/22/18    33 year old fell that her house tumbled and landed on her right wrist and elbow complains of right wrist and elbow pain  Injury date was May 20 she has more pain over the elbow with swelling than the wrist although it is painful as well timing is constant pain context improving associated symptoms swelling but no numbness or tingling  Her left wrist is swollen for the last 6 weeks or so she was told it was a ganglion we will x-ray at next visit    Review of Systems  Musculoskeletal: Positive for joint pain.  Skin: Negative.   Neurological: Negative for tingling.   Current Meds  Medication Sig  . acetaminophen (TYLENOL) 500 MG tablet Take 500 mg by mouth every 6 (six) hours as needed. For pain/fever  . amitriptyline (ELAVIL) 50 MG tablet TAKE 1 TABLET BY MOUTH ONCE DAILY AT BEDTIME TO PREVENT MIGRAINES  . BIOTIN FORTE PO Take 2 tablets by mouth daily.  Marland Kitchen FLUoxetine (PROZAC) 20 MG capsule TAKE 1 CAPSULE BY MOUTH ONCE DAILY IN THE EVENING FOR 30 DAYS  . folic acid (FOLVITE) 400 MCG tablet Take 800 mcg by mouth daily.  Marland Kitchen lovastatin (MEVACOR) 20 MG tablet TAKE 1 & 1 2 (ONE & ONE HALF) TABLETS BY MOUTH ONCE DAILY WITH EVENING MEAL FOR 30 DAYS  . MELATONIN PO Take 1 tablet by mouth at bedtime.  . metFORMIN (GLUCOPHAGE) 500 MG tablet TAKE 1 TABLET BY MOUTH TWICE DAILY WITH MORNING MEAL AND WITH EVENING MEAL FOR 30 DAYS  . NIACIN PO Take 1 tablet by mouth daily.  . Omega-3 Fatty Acids (FISH OIL PO) Take 1 capsule by mouth daily.  Marland Kitchen oxyCODONE-acetaminophen (PERCOCET/ROXICET) 5-325 MG tablet 1 or 2 tabs PO q8h prn pain  . propranolol (INDERAL) 20 MG tablet TAKE 1 TABLET BY MOUTH TWICE DAILY FOR 30 DAYS    Past Medical History:  Diagnosis Date  . Migraines      Allergies  Allergen Reactions  . Amoxicillin  Hives  . Amoxapine And Related Rash     BP (!) 158/95   Pulse 75   Temp 97.6 F (36.4 C)   Ht 5\' 5"  (1.651 m)   Wt 269 lb (122 kg)   LMP 11/03/2018   BMI 44.76 kg/m    Physical Exam General appearance normal Oriented x3 normal Mood pleasant affect normal Gait NORMAL  Ortho Exam Right upper extremity Tenderness over the right elbow with decreased range of motion and tenderness over the distal radius with normal range of motion Shoulder elbow wrist stable Muscle tone and strength was normal without tremor Skin was warm dry and intact Good pulse and temperature were noted in the extremity Sensation revealed no abnormalities to light touch Lymph: Negative elbow  Left wrist tenderness over the ulna and TFCC with swelling painful range of motion flexion extension rotation neurovascular exam intact    MEDICAL DECISION MAKING   Imaging:  X-rays were done at the hospital  Elbow films standard series no fracture or dislocation no effusion  Right wrist shows a small cortical fracture near the radial styloid nondisplaced  ER records are reviewed basically confirm what she is telling us in the office   Encounter Diagnoses  Name Primary?  . Other closed extra-articular fracture of  distal end of right radius, initial encounter Yes  . Contusion of right elbow, initial encounter      PLAN: (RX., injection, surgery,frx,mri/ct, XR 2 body ares) Sling active range of motion 3 times a day out of the sling  Right wrist splint  Return to work on the 26 limited duty with follow-up in 2 weeks and work note for 2 weeks restricted duty  No orders of the defined types were placed in this encounter.  12:46 PM 11/24/2018

## 2018-12-08 ENCOUNTER — Encounter: Payer: Self-pay | Admitting: Orthopedic Surgery

## 2018-12-08 ENCOUNTER — Ambulatory Visit: Payer: Self-pay | Admitting: Orthopedic Surgery

## 2018-12-08 ENCOUNTER — Other Ambulatory Visit: Payer: Self-pay

## 2018-12-08 ENCOUNTER — Ambulatory Visit (INDEPENDENT_AMBULATORY_CARE_PROVIDER_SITE_OTHER): Payer: Self-pay

## 2018-12-08 VITALS — Temp 98.2°F | Ht 65.0 in | Wt 267.0 lb

## 2018-12-08 DIAGNOSIS — M25532 Pain in left wrist: Secondary | ICD-10-CM

## 2018-12-08 DIAGNOSIS — S5001XD Contusion of right elbow, subsequent encounter: Secondary | ICD-10-CM

## 2018-12-08 DIAGNOSIS — S52551D Other extraarticular fracture of lower end of right radius, subsequent encounter for closed fracture with routine healing: Secondary | ICD-10-CM

## 2018-12-08 NOTE — Progress Notes (Signed)
Patient ID: Autumn Boyd, female   DOB: 07/11/1985, 33 y.o.   MRN: [id number]  Office visit  Chief Complaint  Patient presents with  . Elbow Injury    right 11/22/18   . Wrist Pain    left wrist feels better today but would still like xray   . Wrist Injury    right wrist     Encounter Diagnosis  Name Primary?  . Wrist pain, left Yes    CURRENT TREATMENT : Right wrist splint active range of motion sling right elbow  POST INJURY DAY: 16  GLOBAL PERIOD DAY 14/90 4 right wrist  The patient complains of pain over her left wrist with swelling of the left ulna  She has had this for approximately 6 months concerned it was a ganglion  Examination reveals no evidence of ganglion but tenderness over the left ulna TFCC and painful power grip with pain referred over the TFCC range of motion and neurovascular exam are normal  X-ray see report There is no fracture dislocation soft tissue mass or arthritic changes    Brace right wrist  ROM exercises right elbow  Observe left wrist  Fu 3 weeks elbow and right wrist

## 2018-12-08 NOTE — Patient Instructions (Signed)
Work: buggy duty 3 more weeks  Brace right wrist  ROM exercises right elbow  Observe left elbow  Fu 3 weeks

## 2018-12-11 ENCOUNTER — Telehealth: Payer: Self-pay | Admitting: Orthopedic Surgery

## 2018-12-11 NOTE — Telephone Encounter (Signed)
I called her to advise, use Ibuprofen or Tylenol for pain, keep follow up as scheduled. She voiced understanding.

## 2018-12-11 NOTE — Telephone Encounter (Signed)
Oxycodone-Acetaminophen 5/325 mg   Patient was given this in ER on 11/22/18 for pain. She is asking if Dr. Aline Brochure would prescribe this for her pain.   PATIENT USES [facility]

## 2018-12-11 NOTE — Telephone Encounter (Signed)
Autumn Boyd, NO

## 2018-12-28 DIAGNOSIS — S52501D Unspecified fracture of the lower end of right radius, subsequent encounter for closed fracture with routine healing: Secondary | ICD-10-CM | POA: Insufficient documentation

## 2018-12-29 ENCOUNTER — Ambulatory Visit: Payer: Self-pay | Admitting: Orthopedic Surgery

## 2018-12-29 ENCOUNTER — Encounter: Payer: Self-pay | Admitting: Orthopedic Surgery

## 2018-12-29 ENCOUNTER — Other Ambulatory Visit: Payer: Self-pay

## 2018-12-29 VITALS — Temp 97.5°F

## 2018-12-29 DIAGNOSIS — S52551D Other extraarticular fracture of lower end of right radius, subsequent encounter for closed fracture with routine healing: Secondary | ICD-10-CM

## 2018-12-29 DIAGNOSIS — S5001XD Contusion of right elbow, subsequent encounter: Secondary | ICD-10-CM

## 2018-12-29 DIAGNOSIS — M25532 Pain in left wrist: Secondary | ICD-10-CM

## 2018-12-29 NOTE — Patient Instructions (Signed)
Right wrist splint use as needed   Home exercises for right elbow   WORK NOTE: START REGISTER WORK [employer] AND OFFICE X 4 WEEKS   THEN RESUME NORMAL ACTIVITY

## 2018-12-29 NOTE — Progress Notes (Signed)
Chief Complaint  Patient presents with  . Wrist Injury    11/22/18 better right wrist fracture   . Elbow Pain    right/ still sore    Patient says her right wrist has significantly improved and is able do more at work.  She still has soreness along the lateral portion of her elbow into the mobile wad of 3.  Her left wrist is still painful especially with supination and pain is located over the ulna and TFCC  Review of systems denies numbness or tingling in the right or left upper extremity  Temp (!) 97.5 F (36.4 C)   LMP 12/01/2018 (Exact Date)   Right upper extremity Tenderness over the mobile wad of 3 full range of motion of the elbow no pain with pronation supination no pain with extension but she does feel tightness with flexion extension  Wrist full range of motion no tenderness on the right side  Left wrist painful supination tenderness over the ulna and TFCC  Neurovascular intact both sides skin normal both sides  Encounter Diagnoses  Name Primary?  . Other closed extra-articular fracture of distal end of right radius with routine healing, subsequent encounter 11/22/18 Yes  . Contusion of right elbow, subsequent encounter   . Wrist pain, left    Problem #1 wrist fracture improved Problem #2 right elbow contusion improving Problem #3 left wrist no improvement  She can remove the wrist brace use it as tolerated  Elbow stretching exercises for the right elbow  The patient says she will let us know when she cannot tolerate the wrist pain on the left side any more.

## 2019-05-27 ENCOUNTER — Emergency Department (HOSPITAL_COMMUNITY)
Admission: EM | Admit: 2019-05-27 | Discharge: 2019-05-27 | Disposition: A | Discharge status: Home / Self Care | Payer: Self-pay | Attending: Emergency Medicine | Admitting: Emergency Medicine

## 2019-05-27 ENCOUNTER — Emergency Department (HOSPITAL_COMMUNITY): Payer: Self-pay

## 2019-05-27 ENCOUNTER — Other Ambulatory Visit: Payer: Self-pay

## 2019-05-27 ENCOUNTER — Encounter (HOSPITAL_COMMUNITY): Payer: Self-pay | Admitting: Emergency Medicine

## 2019-05-27 DIAGNOSIS — M2500 Hemarthrosis, unspecified joint: Secondary | ICD-10-CM

## 2019-05-27 DIAGNOSIS — X501XXA Overexertion from prolonged static or awkward postures, initial encounter: Secondary | ICD-10-CM | POA: Insufficient documentation

## 2019-05-27 DIAGNOSIS — Y929 Unspecified place or not applicable: Secondary | ICD-10-CM | POA: Insufficient documentation

## 2019-05-27 DIAGNOSIS — Z7722 Contact with and (suspected) exposure to environmental tobacco smoke (acute) (chronic): Secondary | ICD-10-CM | POA: Insufficient documentation

## 2019-05-27 DIAGNOSIS — M25562 Pain in left knee: Secondary | ICD-10-CM

## 2019-05-27 DIAGNOSIS — S83422A Sprain of lateral collateral ligament of left knee, initial encounter: Secondary | ICD-10-CM | POA: Insufficient documentation

## 2019-05-27 DIAGNOSIS — Y999 Unspecified external cause status: Secondary | ICD-10-CM | POA: Insufficient documentation

## 2019-05-27 DIAGNOSIS — M25062 Hemarthrosis, left knee: Secondary | ICD-10-CM | POA: Insufficient documentation

## 2019-05-27 DIAGNOSIS — Y9389 Activity, other specified: Secondary | ICD-10-CM | POA: Insufficient documentation

## 2019-05-27 HISTORY — DX: Polycystic ovarian syndrome: E28.2

## 2019-05-27 MED ORDER — HYDROCODONE-ACETAMINOPHEN 5-325 MG PO TABS: 1.0000 | ORAL_TABLET | ORAL | 0 refills | Status: DC | PRN

## 2019-05-27 NOTE — Discharge Instructions (Addendum)
You were evaluated in the Emergency Department and after careful evaluation, we did not find any emergent condition requiring admission or further testing in the hospital.  Your exam/testing today is overall reassuring.  Your x-rays did not show any broken bones.  There was evidence of some fluid inside your knee joint.  This is likely blood, possibly from an injury to one of your knee ligaments.  As discussed, please use Tylenol or Motrin during the day for discomfort.  Try to rest and elevate the knee as often as you can for the next several days and follow-up with orthopedics.  Please return to the Emergency Department if you experience any worsening of your condition.  We encourage you to follow up with a primary care provider.  Thank you for allowing [place] to be a part of your care.

## 2019-05-27 NOTE — ED Provider Notes (Signed)
[facility]-EMERGENCY DEPT [facility] Emergency Department Provider Note MRN:  [id number]  Arrival date & time: 05/27/19     Chief Complaint   Knee pain History of Present Illness   Autumn Boyd is a 33 y.o. year-old female with a history of migraines presenting to the ED with chief complaint of knee pain.  Patient explains that yesterday she was repairing a car.  She bent down onto both knees and when doing so she suddenly felt a pop in the left knee.  She has had considerable pain since that time.  She has had to continue to work since the injury, and has noted swelling to the left ankle.  Denies numbness or weakness, no other injuries.  Pain is moderate, worse with motion or palpation.  Review of Systems  A complete 10 system review of systems was obtained and all systems are negative except as noted in the HPI and PMH.   Patient's Health History    Past Medical History:  Diagnosis Date  . Migraines   . PCOS (polycystic ovarian syndrome)     Past Surgical History:  Procedure Laterality Date  . WISDOM TOOTH EXTRACTION      Family History  Problem Relation Age of Onset  . Hypertension Father   . Diabetes Father   . Diabetes Mother   . Cancer Maternal Grandfather        mouth    Social History   Socioeconomic History  . Marital status: Married    Spouse name: Not on file  . Number of children: Not on file  . Years of education: Not on file  . Highest education level: Not on file  Occupational History  . Not on file  Social Needs  . Financial resource strain: Not on file  . Food insecurity    Worry: Not on file    Inability: Not on file  . Transportation needs    Medical: Not on file    Non-medical: Not on file  Tobacco Use  . Smoking status: Passive Smoke Exposure - Never Smoker  . Smokeless tobacco: Never Used  Substance and Sexual Activity  . Alcohol use: Yes    Comment: occasionally  . Drug use: No  . Sexual activity: Yes    Birth  control/protection: None  Lifestyle  . Physical activity    Days per week: Not on file    Minutes per session: Not on file  . Stress: Not on file  Relationships  . Social Musician on phone: Not on file    Gets together: Not on file    Attends religious service: Not on file    Active member of club or organization: Not on file    Attends meetings of clubs or organizations: Not on file    Relationship status: Not on file  . Intimate partner violence    Fear of current or ex partner: Not on file    Emotionally abused: Not on file    Physically abused: Not on file    Forced sexual activity: Not on file  Other Topics Concern  . Not on file  Social History Narrative  . Not on file     Physical Exam  Vital Signs and Nursing Notes reviewed Vitals:   05/27/19 1611  BP: (!) 141/71  Pulse: 80  Resp: 18  Temp: 98.3 F (36.8 C)  SpO2: 98%    CONSTITUTIONAL: Well-appearing, NAD NEURO:  Alert and oriented x 3, no focal deficits EYES:  eyes equal and reactive ENT/NECK:  no LAD, no JVD CARDIO: Regular rate, well-perfused, normal S1 and S2 PULM:  CTAB no wheezing or rhonchi GI/GU:  normal bowel sounds, non-distended, non-tender MSK/SPINE:  No gross deformities, no edema; tenderness palpation to the lateral aspect of the left knee, significant pain elicited with varus deformity SKIN:  no rash, atraumatic PSYCH:  Appropriate speech and behavior  Diagnostic and Interventional Summary    EKG Interpretation  Date/Time:    Ventricular Rate:    PR Interval:    QRS Duration:   QT Interval:    QTC Calculation:   R Axis:     Text Interpretation:        Labs Reviewed - No data to display  DG Knee Complete 4 Views Left  Final Result    DG Tibia/Fibula Left  Final Result      Medications - No data to display   Procedures  /  Critical Care Procedures  ED Course and Medical Decision Making  I have reviewed the triage vital signs and the nursing notes.   Pertinent labs & imaging results that were available during my care of the patient were reviewed by me and considered in my medical decision making (see below for details).     Suspect ligamentous injury, possibly the LCL given the location of the pain.  X-ray to exclude fracture.  X-ray is are without acute fracture, there is mild to moderate knee effusion.  Patient has preserved range of motion, no increased warmth, no fever, this is not thought to be due to infection.  Rather this is thought to be due to hemarthrosis in the setting of possible ligamentous injury.  Placed in knee immobilizer, provided crutches, referred to orthopedics.  Barth Kirks. [place] Small, [place] [email or web address]  Final Clinical Impressions(s) / ED Diagnoses     ICD-10-CM   1. Acute pain of left knee  M25.562   2. Hemarthrosis  M25.00   3. Sprain of lateral collateral ligament of left knee, initial encounter  L24.401U     ED Discharge Orders         Ordered    HYDROcodone-acetaminophen (NORCO/VICODIN) 5-325 MG tablet  Every 4 hours PRN     05/27/19 1658           Discharge Instructions Discussed with and Provided to Patient:     Discharge Instructions     You were evaluated in the Emergency Department and after careful evaluation, we did not find any emergent condition requiring admission or further testing in the hospital.  Your exam/testing today is overall reassuring.  Your x-rays did not show any broken bones.  There was evidence of some fluid inside your knee joint.  This is likely blood, possibly from an injury to one of your knee ligaments.  As discussed, please use Tylenol or Motrin during the day for discomfort.  Try to rest and elevate the knee as often as you can for the next several days and follow-up with orthopedics.  Please return to the Emergency Department if you experience any worsening of your condition.  We encourage you to follow up  with a primary care provider.  Thank you for allowing [place] to be a part of your care.       Maudie Flakes, MD 05/27/19 [phone]

## 2019-05-27 NOTE — ED Triage Notes (Signed)
Patient c/o left knee and ankle pain. Per patient "squated down Tuesday and felt a pull and pop." Per patient swelling and pain since. Patient states she also fell on Thursday due to leg. Patient took ibuprofen 800mg  and tramadol last night with no relief. Patient ambulatory. No obvious deformity noted.

## 2019-05-28 ENCOUNTER — Telehealth: Payer: Self-pay | Admitting: Orthopedic Surgery

## 2019-05-28 NOTE — Telephone Encounter (Signed)
Called patient for this appointment. Verified.

## 2019-05-28 NOTE — Telephone Encounter (Signed)
Autumn Boyd

## 2019-05-28 NOTE — Telephone Encounter (Signed)
Patient called to request appointment following emergency room visit at [facility]. States has out of work note just until Friday, day after Thanksgiving.    Suspect ligamentous injury, possibly the LCL given the location of the pain.  X-ray to exclude fracture.  X-ray is are without acute fracture, there is mild to moderate knee effusion.  Patient has preserved range of motion, no increased warmth, no fever, this is not thought to be due to infection.  Rather this is thought to be due to hemarthrosis in the setting of possible ligamentous injury.  Placed in knee immobilizer, provided crutches, referred to orthopedics.  Please advise regarding possible visit this week?

## 2019-05-30 ENCOUNTER — Encounter: Payer: Self-pay | Admitting: Orthopedic Surgery

## 2019-05-30 ENCOUNTER — Ambulatory Visit: Payer: Self-pay | Admitting: Orthopedic Surgery

## 2019-05-30 ENCOUNTER — Other Ambulatory Visit: Payer: Self-pay

## 2019-05-30 VITALS — BP 146/90 | HR 85 | Ht 65.0 in | Wt 282.0 lb

## 2019-05-30 DIAGNOSIS — S83002A Unspecified subluxation of left patella, initial encounter: Secondary | ICD-10-CM

## 2019-05-30 MED ORDER — IBUPROFEN 800 MG PO TABS: 800.0000 mg | ORAL_TABLET | Freq: Three times a day (TID) | ORAL | 1 refills | Status: DC | PRN

## 2019-05-30 MED ORDER — HYDROCODONE-ACETAMINOPHEN 5-325 MG PO TABS: 1.0000 | ORAL_TABLET | Freq: Four times a day (QID) | ORAL | 0 refills | Status: DC | PRN

## 2019-05-30 NOTE — Patient Instructions (Signed)
Hopefully you can get the brace that allows the knee to bend  Your weightbearing as tolerated use a cane or crutch for support  Ice the knee 30 minutes 6 times a day and after icing bend the knee 20 times as far as you can  Take hydrocodone for pain along with ibuprofen you have prescriptions.  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

## 2019-05-30 NOTE — Progress Notes (Signed)
Anita Laguna Tarver  05/30/2019  Body mass index is 46.93 kg/m.   HISTORY SECTION :  Chief Complaint  Patient presents with  . Knee Pain    left    HPI The patient presents for evaluation of  (mild/moderate/severe/ ) moderate  pain, in the (right /left) left knee  , for 7 , associated with swelling and decreased range of motion  Apparently she knelt down to help her father with an auctionater, felt a pop and pain in her left knee.  However she did go to work the next day and then by Friday she noticed pain and swelling and went to the emergency room for evaluation on the 22nd at which time x-rays were taken she had an effusion and knee sprain was diagnosed and she was sent home in a knee immobilizer with ibuprofen and hydrocodone she presents to [place] for evaluation and management   Review of Systems  Constitutional: Negative.   Neurological: Negative.      has a past medical history of Migraines and PCOS (polycystic ovarian syndrome).   Past Surgical History:  Procedure Laterality Date  . WISDOM TOOTH EXTRACTION      Body mass index is 46.93 kg/m.   Allergies  Allergen Reactions  . Amoxicillin Hives  . Amoxapine And Related Rash     Current Outpatient Medications:  .  acetaminophen (TYLENOL) 500 MG tablet, Take 500 mg by mouth every 6 (six) hours as needed. For pain/fever, Disp: , Rfl:  .  amitriptyline (ELAVIL) 50 MG tablet, TAKE 1 TABLET BY MOUTH ONCE DAILY AT BEDTIME TO PREVENT MIGRAINES, Disp: , Rfl: 3 .  BIOTIN FORTE PO, Take 2 tablets by mouth daily., Disp: , Rfl:  .  cetirizine (ZYRTEC) 10 MG tablet, Take 10 mg by mouth daily., Disp: , Rfl:  .  FLUoxetine (PROZAC) 20 MG capsule, TAKE 1 CAPSULE BY MOUTH ONCE DAILY IN THE EVENING FOR 30 DAYS, Disp: , Rfl: 6 .  folic acid (FOLVITE) 400 MCG tablet, Take 800 mcg by mouth daily., Disp: , Rfl:  .  lovastatin (MEVACOR) 20 MG tablet, TAKE 1 & 1 2 (ONE & ONE HALF) TABLETS BY MOUTH ONCE DAILY WITH EVENING MEAL FOR 30 DAYS,  Disp: , Rfl: 6 .  MELATONIN PO, Take 1 tablet by mouth at bedtime., Disp: , Rfl:  .  NIACIN PO, Take 1 tablet by mouth daily., Disp: , Rfl:  .  Omega-3 Fatty Acids (FISH OIL PO), Take 1 capsule by mouth daily., Disp: , Rfl:  .  propranolol (INDERAL) 20 MG tablet, TAKE 1 TABLET BY MOUTH TWICE DAILY FOR 30 DAYS, Disp: , Rfl: 6 .  gabapentin (NEURONTIN) 300 MG capsule, Take 300 mg by mouth 2 (two) times daily., Disp: , Rfl:  .  HYDROcodone-acetaminophen (NORCO/VICODIN) 5-325 MG tablet, Take 1 tablet by mouth every 6 (six) hours as needed for moderate pain., Disp: 30 tablet, Rfl: 0 .  ibuprofen (ADVIL) 800 MG tablet, Take 1 tablet (800 mg total) by mouth every 8 (eight) hours as needed., Disp: 90 tablet, Rfl: 1 .  metFORMIN (GLUCOPHAGE) 500 MG tablet, TAKE 1 TABLET BY MOUTH TWICE DAILY WITH MORNING MEAL AND WITH EVENING MEAL FOR 30 DAYS, Disp: , Rfl: 6   PHYSICAL EXAM SECTION: 1) BP (!) 146/90   Pulse 85   Ht 5\' 5"  (1.651 m)   Wt 282 lb (127.9 kg)   BMI 46.93 kg/m   Body mass index is 46.93 kg/m. General appearance: Well-developed well-nourished no gross deformities  2) Cardiovascular normal pulse and perfusion in the lower  extremities normal color without edema  3) Neurologically deep tendon reflexes are equal and normal, no sensation loss or deficits no pathologic reflexes  4) Psychological: Awake alert and oriented x3 mood and affect normal  5) Skin no lacerations or ulcerations no nodularity no palpable masses, no erythema or nodularity  6) Musculoskeletal:   Right knee: normal alignment and range of motion.  Normal muscle tone and strength  Left knee she has an effusion.  She is tender over the medial retinaculum and the lateral collateral ligaments medial collateral ligaments are stable as is the ACL and PCL.  Her range of motion is limited 0 to 90 degrees her straight leg raise is intact  Muscle tone is normal   MEDICAL DECISION SECTION:  Encounter Diagnosis  Name Primary?   . Subluxation of left patella, initial encounter Yes    Imaging   4 views of the left knee normal alignment no fracture dislocation there does appear to be joint effusion  A dictated report was reviewed by Dr. Nyoka Cowden IMPRESSION: Small to moderate-sized joint effusion. No acute bony abnormality or significant degenerative changes.     Electronically Signed   By: Constance Holster M.D.   On: 05/27/2019 16:34  Plan:  (Rx., Inj., surg., Frx, MRI/CT, XR:2)   Hopefully you can get the brace that allows the knee to bend  Your weightbearing as tolerated use a cane or crutch for support  Ice the knee 30 minutes 6 times a day and after icing bend the knee 20 times as far as you can  Take hydrocodone for pain along with ibuprofen you have prescriptions.  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

## 2019-06-08 ENCOUNTER — Encounter: Payer: Self-pay | Admitting: Orthopedic Surgery

## 2019-06-08 ENCOUNTER — Ambulatory Visit (INDEPENDENT_AMBULATORY_CARE_PROVIDER_SITE_OTHER): Payer: Self-pay | Admitting: Orthopedic Surgery

## 2019-06-08 ENCOUNTER — Other Ambulatory Visit: Payer: Self-pay

## 2019-06-08 VITALS — Temp 97.6°F | Ht 65.0 in | Wt 281.0 lb

## 2019-06-08 DIAGNOSIS — S83002D Unspecified subluxation of left patella, subsequent encounter: Secondary | ICD-10-CM

## 2019-06-08 DIAGNOSIS — M25462 Effusion, left knee: Secondary | ICD-10-CM

## 2019-06-08 NOTE — Progress Notes (Signed)
Chief Complaint  Patient presents with  . Knee Pain    L/still have the knot/swelling alot of times all the way down to foot     33 year old female injured her knee on 22 November when she bent down to help her father with a piece of equipment felt a pop in the knee presents now after taking ibuprofen ice 5 days hydrocodone bracing and work restrictions with continued pain medial left side of the knee and knee effusion with painful knee flexion  Review of systems negative for numbness or tingling in the left leg  Exam shows that she is limping with the brace on favoring the left knee she has a large effusion she can bend the knee about 95 degrees it extends to 0 but she is to hyperextend her at 5 degrees on the opposite side the ACL and PCL and collateral ligaments feel stable she still has a lot of pain in the superior patellar region and joint medial retinaculum is tender as last time  Neurovascular exam is intact  Aspiration left knee  Patient gave verbal consent site confirmed his left knee through a lateral approach through sterile conditions we aspirated 30 cc of yellow fluid from the left knee Patient tolerated the procedure well  Encounter Diagnoses  Name Primary?  . Subluxation of left patella, subsequent encounter Yes  . Effusion of knee joint, left    To new work restrictions and ibuprofen continue bracing and follow-up in 2 weeks to reassess the knee

## 2019-06-08 NOTE — Patient Instructions (Signed)
Repeat there work restrictions for another 2 weeks  Continue ice  Continue bracing  Continue ibuprofen

## 2019-06-11 ENCOUNTER — Telehealth: Payer: Self-pay | Admitting: Orthopedic Surgery

## 2019-06-11 ENCOUNTER — Encounter: Payer: Self-pay | Admitting: Orthopedic Surgery

## 2019-06-11 ENCOUNTER — Ambulatory Visit (INDEPENDENT_AMBULATORY_CARE_PROVIDER_SITE_OTHER): Payer: Self-pay | Admitting: Orthopedic Surgery

## 2019-06-11 ENCOUNTER — Other Ambulatory Visit: Payer: Self-pay

## 2019-06-11 VITALS — BP 130/73 | HR 81 | Temp 97.0°F | Ht 65.0 in | Wt 281.0 lb

## 2019-06-11 DIAGNOSIS — M25462 Effusion, left knee: Secondary | ICD-10-CM

## 2019-06-11 NOTE — Patient Instructions (Addendum)
OOW X 1 WEEK APPLY ICE MORE FREQUENTLY 5-6 times a day for 30 minutes  MRI WILL BE ORDERED FOR THE KNEE

## 2019-06-11 NOTE — Progress Notes (Signed)
Chief Complaint  Patient presents with  . Follow-up    Recheck on left knee.    HISTORY OF  Apparently she knelt down to help her father with an auctionater, felt a pop and pain in her left knee.  However she did go to work the next day and then by Friday she noticed pain and swelling and went to the emergency room for evaluation on the 22nd at which time x-rays were taken she had an effusion and knee sprain was diagnosed and she was sent home in a knee immobilizer with ibuprofen and hydrocodone she presents to [place] for evaluation and management  KNEE WAS ASPIRATED AND SHE WENT TO WORK, CALLED [employer]  Although she has not had a new injury the knee is more swollen more painful  Review of systems no numbness or tingling but now she is having some back pain and some symptoms on the right knee from transference of weight  BP 130/73   Pulse 81   Temp (!) 97 F (36.1 C)   Ht 5\' 5"  (1.651 m)   Wt 281 lb (127.5 kg)   BMI 46.76 kg/m   The patient meets the AMA guidelines for Morbid (severe) obesity with a BMI > 40.0 and I have recommended weight loss.  Left knee exam diffuse tenderness around the knee joint, large effusion alignment looks normal.  Knee range of motion is 0 to 85 degrees but she is hyperextended so she is actually lacking 5 degrees of extension All the ligaments feel stable the medial femoral condyle and medial joint line are the primary tender areas Muscle tone is normal Neurovascular exam is intact  Encounter Diagnosis  Name Primary?  . Effusion of knee joint, left Yes    Recommend out of work 1 week return with MRI ice the knee 6 times a day continue ibuprofen and NORCO

## 2019-06-11 NOTE — Telephone Encounter (Signed)
Call from patient, question following appointment 06/08/19; states "the fluid pocket is filling back up again"; said she is at work and having a difficult time. Offered appointment this morning-checking with her employer now regarding leaving work for this visit.

## 2019-06-18 ENCOUNTER — Telehealth: Payer: Self-pay | Admitting: Orthopedic Surgery

## 2019-06-18 ENCOUNTER — Telehealth: Payer: Self-pay

## 2019-06-18 NOTE — Telephone Encounter (Signed)
Patient states she has not heard anything about her MRI appointment and has questions about her work.  Please call and advise

## 2019-06-18 NOTE — Telephone Encounter (Signed)
yes

## 2019-06-18 NOTE — Telephone Encounter (Signed)
Dr. Aline Brochure can call her with MRI Report Ok to [facility] appointment for MRI review  To Dr Aline Brochure for work question

## 2019-06-18 NOTE — Telephone Encounter (Signed)
I called and scheduled MRI for 06/26/19 at 4:00pm [facility]. Patient to arrive by 3:30 pm. Called patient and she is aware.

## 2019-06-18 NOTE — Telephone Encounter (Signed)
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  She also states her MRI is not scheduled until the 22nd but her follow up visit here is on the 18th.  She wants to know if she needs to keep the appointment on the 18th or can this appointment wait until after the MRI?  Please advise on return to work note can be written and whether pt should reschedule the appointment for the 18th  Thanks

## 2019-06-18 NOTE — Telephone Encounter (Signed)
Tammy will call them to make appointment

## 2019-06-19 ENCOUNTER — Encounter: Payer: Self-pay | Admitting: Orthopedic Surgery

## 2019-06-19 NOTE — Telephone Encounter (Signed)
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

## 2019-06-19 NOTE — Telephone Encounter (Signed)
Patient notified.  Note was written and the patient came by and picked this up.

## 2019-06-22 ENCOUNTER — Ambulatory Visit: Payer: Self-pay | Admitting: Orthopedic Surgery

## 2019-06-25 ENCOUNTER — Encounter: Payer: Self-pay | Admitting: Orthopedic Surgery

## 2019-06-25 ENCOUNTER — Telehealth: Payer: Self-pay | Admitting: Orthopedic Surgery

## 2019-06-25 NOTE — Telephone Encounter (Signed)
Patient called to ask if she may need to stay out of work today, 06/25/19, due to knee swelling; said 'fluid pocket filled up again", and be out of work at least until her MRI appointment which is tomorrow, 06/26/19, 4:00pm; please advise.

## 2019-06-25 NOTE — Telephone Encounter (Signed)
Yes, ok to give note out of work  today and until her appointment / thanks

## 2019-06-25 NOTE — Telephone Encounter (Signed)
Done

## 2019-06-25 NOTE — Telephone Encounter (Signed)
Called back to patient; notified; aware letter is ready for pick up after we re-open for afternoon hours.

## 2019-06-26 ENCOUNTER — Other Ambulatory Visit: Payer: Self-pay

## 2019-06-26 ENCOUNTER — Ambulatory Visit (HOSPITAL_COMMUNITY)
Admission: RE | Admit: 2019-06-26 | Discharge: 2019-06-26 | Disposition: A | Discharge status: Home / Self Care | Payer: Self-pay | Source: Ambulatory Visit | Attending: Orthopedic Surgery | Admitting: Orthopedic Surgery

## 2019-06-26 DIAGNOSIS — M25462 Effusion, left knee: Secondary | ICD-10-CM | POA: Insufficient documentation

## 2019-07-02 ENCOUNTER — Telehealth: Payer: Self-pay | Admitting: Orthopedic Surgery

## 2019-07-02 NOTE — Telephone Encounter (Signed)
Patient asking about results of MRI, done on 06/26/19, at [facility]. Ph [phone]

## 2019-07-02 NOTE — Telephone Encounter (Signed)
Results were given no acute tear is seen joint of fluid is noted  Patient will come in for aspirations when needed Monday Wednesday or Friday morning

## 2019-09-15 ENCOUNTER — Emergency Department (HOSPITAL_COMMUNITY)
Admission: EM | Admit: 2019-09-15 | Discharge: 2019-09-15 | Disposition: A | Discharge status: Home / Self Care | Payer: Self-pay | Attending: Emergency Medicine | Admitting: Emergency Medicine

## 2019-09-15 ENCOUNTER — Other Ambulatory Visit: Payer: Self-pay

## 2019-09-15 ENCOUNTER — Encounter (HOSPITAL_COMMUNITY): Payer: Self-pay

## 2019-09-15 ENCOUNTER — Emergency Department (HOSPITAL_COMMUNITY): Payer: Self-pay

## 2019-09-15 DIAGNOSIS — M25572 Pain in left ankle and joints of left foot: Secondary | ICD-10-CM | POA: Insufficient documentation

## 2019-09-15 DIAGNOSIS — R2242 Localized swelling, mass and lump, left lower limb: Secondary | ICD-10-CM | POA: Insufficient documentation

## 2019-09-15 DIAGNOSIS — Z7984 Long term (current) use of oral hypoglycemic drugs: Secondary | ICD-10-CM | POA: Insufficient documentation

## 2019-09-15 DIAGNOSIS — Z79899 Other long term (current) drug therapy: Secondary | ICD-10-CM | POA: Insufficient documentation

## 2019-09-15 DIAGNOSIS — Z3202 Encounter for pregnancy test, result negative: Secondary | ICD-10-CM | POA: Insufficient documentation

## 2019-09-15 DIAGNOSIS — Z7722 Contact with and (suspected) exposure to environmental tobacco smoke (acute) (chronic): Secondary | ICD-10-CM | POA: Insufficient documentation

## 2019-09-15 DIAGNOSIS — R0602 Shortness of breath: Secondary | ICD-10-CM | POA: Insufficient documentation

## 2019-09-15 LAB — HCG, QUANTITATIVE, PREGNANCY: hCG, Beta Chain, Quant, S: <1 m[IU]/mL (ref <5)

## 2019-09-15 LAB — BRAIN NATRIURETIC PEPTIDE: B Natriuretic Peptide: 10 pg/mL (ref 0.0–100.0)

## 2019-09-15 LAB — D-DIMER, QUANTITATIVE: D-Dimer, Quant: 0.5 ug/mL-FEU (ref 0.00–0.50)

## 2019-09-15 NOTE — Discharge Instructions (Addendum)
Take ibuprofen 600 mg 4 times a day for pain. Follow up with Dr Romeo Apple or Hilda Lias about your knee and ankle pain. Follow up with your primary care provider about the swelling in your legs. Wear the ankle wrap for support especially when you are working.

## 2019-09-15 NOTE — ED Provider Notes (Signed)
[facility] EMERGENCY DEPARTMENT Provider Note   CSN: [id number] Arrival date & time: 09/15/19  0028   Time seen 12:54 AM  History Chief Complaint  Patient presents with  . Multiple complaints    Autumn Boyd is a 34 y.o. female.  HPI   Patient states she sprained her left knee about 5 months ago.  She was seen by Dr. Hilda Lias and Dr. Romeo Apple and had fluid drained from her knee.  She states she had an MRI of her knee in February and she was called and told that there is nothing acute there.  She states she has noted there is swelling in her lower leg for the past 5 months.  She was placed on a fluid pill about a month ago and states it was Lasix.  She thinks it was a 20 mg tablet and she takes half a tablet a day.  She states it was helping but not now.  She states when she wakes up in the morning there is no swelling but after she stands all day at her job at [employer] she gets swelling.  She states tonight she had a lot of pain and swelling in her left ankle after she took a shower.  She states both ankles have been swelling for the past 2 weeks.  She had a virtual visit with her PCP earlier this week and he said if her swelling did not get better he was going to refer her to a cardiologist.  She states her father and her daughter both have congestive heart failure.  She denies chest pain or shortness of breath but she states she did have some wheezing driving to the ED tonight.  She states she had pneumonia 3 years in a row starting 5 years ago and since then has wheezing off and on.  She does not smoke but states both her husband and her father who moved in with her smoke.  She denies fever or coughing.  PCP Nechama Guard, FNP   Past Medical History:  Diagnosis Date  . Migraines   . PCOS (polycystic ovarian syndrome)     Patient Active Problem List   Diagnosis Date Noted  . Closed fracture of lower end of right radius with routine healing 11/22/18 12/28/2018    Past  Surgical History:  Procedure Laterality Date  . WISDOM TOOTH EXTRACTION       OB History    Gravida  1   Para      Term      Preterm      AB  1   Living        SAB  1   TAB      Ectopic      Multiple      Live Births              Family History  Problem Relation Age of Onset  . Hypertension Father   . Diabetes Father   . Diabetes Mother   . Cancer Maternal Grandfather        mouth    Social History   Tobacco Use  . Smoking status: Passive Smoke Exposure - Never Smoker  . Smokeless tobacco: Never Used  Substance Use Topics  . Alcohol use: Yes    Comment: occasionally  . Drug use: No  employed Lives with spouse, Father moved in to live with her  Home Medications Prior to Admission medications   Medication Sig Start Date End Date  Taking? Authorizing Provider  acetaminophen (TYLENOL) 500 MG tablet Take 500 mg by mouth every 6 (six) hours as needed. For pain/fever    [provider]  amitriptyline (ELAVIL) 50 MG tablet TAKE 1 TABLET BY MOUTH ONCE DAILY AT BEDTIME TO PREVENT MIGRAINES 03/14/18   [provider]  BIOTIN FORTE PO Take 2 tablets by mouth daily.    [provider]  cetirizine (ZYRTEC) 10 MG tablet Take 10 mg by mouth daily.    [provider]  FLUoxetine (PROZAC) 20 MG capsule TAKE 1 CAPSULE BY MOUTH ONCE DAILY IN THE EVENING FOR 30 DAYS 04/26/18   [provider]  folic acid (FOLVITE) 400 MCG tablet Take 800 mcg by mouth daily.    [provider]  gabapentin (NEURONTIN) 300 MG capsule Take 300 mg by mouth 2 (two) times daily. 01/14/19   [provider]  HYDROcodone-acetaminophen (NORCO/VICODIN) 5-325 MG tablet Take 1 tablet by mouth every 6 (six) hours as needed for moderate pain. 05/30/19   Vickki Hearing, MD  ibuprofen (ADVIL) 800 MG tablet Take 1 tablet (800 mg total) by mouth every 8 (eight) hours as needed. 05/30/19   Vickki Hearing, MD  lovastatin (MEVACOR) 20 MG  tablet TAKE 1 & 1 2 (ONE & ONE HALF) TABLETS BY MOUTH ONCE DAILY WITH EVENING MEAL FOR 30 DAYS 03/28/18   [provider]  MELATONIN PO Take 1 tablet by mouth at bedtime.    [provider]  metFORMIN (GLUCOPHAGE) 500 MG tablet TAKE 1 TABLET BY MOUTH TWICE DAILY WITH MORNING MEAL AND WITH EVENING MEAL FOR 30 DAYS 05/08/18   [provider]  NIACIN PO Take 1 tablet by mouth daily.    [provider]  Omega-3 Fatty Acids (FISH OIL PO) Take 1 capsule by mouth daily.    [provider]  propranolol (INDERAL) 20 MG tablet TAKE 1 TABLET BY MOUTH TWICE DAILY FOR 30 DAYS 03/28/18   [provider]  Patient states she does not take the Metformin or gabapentin anymore  Allergies    Amoxicillin and Amoxapine and related  Review of Systems   Review of Systems  All other systems reviewed and are negative.   Physical Exam Updated Vital Signs BP 134/66   Pulse 82   Temp 98.5 F (36.9 C) (Oral)   Resp (!) 21   Ht 5' 4.5" (1.638 m)   Wt 127.9 kg   SpO2 98%   BMI 47.66 kg/m   Physical Exam Vitals and nursing note reviewed.  Constitutional:      General: She is not in acute distress.    Appearance: Normal appearance. She is well-developed. She is obese. She is not ill-appearing or toxic-appearing.  HENT:     Head: Normocephalic and atraumatic.     Right Ear: External ear normal.     Left Ear: External ear normal.     Nose: Nose normal. No mucosal edema or rhinorrhea.     Mouth/Throat:     Dentition: No dental abscesses.     Pharynx: No uvula swelling.  Eyes:     Conjunctiva/sclera: Conjunctivae normal.     Pupils: Pupils are equal, round, and reactive to light.  Cardiovascular:     Rate and Rhythm: Normal rate and regular rhythm.     Heart sounds: Normal heart sounds. No murmur. No friction rub. No gallop.   Pulmonary:     Effort: Pulmonary effort is normal. No respiratory distress.     Breath sounds:  Normal breath sounds. No wheezing,  rhonchi or rales.  Chest:     Chest wall: No tenderness or crepitus.  Abdominal:     General: Bowel sounds are normal. There is no distension.     Palpations: Abdomen is soft.     Tenderness: There is no abdominal tenderness. There is no guarding or rebound.  Musculoskeletal:        General: No tenderness. Normal range of motion.     Cervical back: Full passive range of motion without pain, normal range of motion and neck supple.     Right lower leg: No edema.     Left lower leg: No edema.     Comments: Moves all extremities well.  There is no pitting to her lower legs.  Her right knee is nontender, her right lower leg is nontender as is her right ankle.  She has some minor discomfort to the left knee anteriorly.  There is no joint effusion noted.  She has mild discomfort diffusely in her left lower leg and around her left ankle however I do not appreciate any pitting edema.  Skin:    General: Skin is warm and dry.     Coloration: Skin is not pale.     Findings: No erythema or rash.  Neurological:     Mental Status: She is alert and oriented to person, place, and time.     Cranial Nerves: No cranial nerve deficit.  Psychiatric:        Mood and Affect: Mood is not anxious.        Speech: Speech normal.        Behavior: Behavior normal.     ED Results / Procedures / Treatments   Labs (all labs ordered are listed, but only abnormal results are displayed) Results for orders placed or performed during the hospital encounter of 09/15/19  D-dimer, quantitative  Result Value Ref Range   D-Dimer, Quant 0.50 0.00 - 0.50 ug/mL-FEU  Brain natriuretic peptide  Result Value Ref Range   B Natriuretic Peptide 10.0 0.0 - 100.0 pg/mL  hCG, quantitative, pregnancy  Result Value Ref Range   hCG, Beta Chain, Quant, S <1 <5 mIU/mL   Laboratory interpretation all normal     EKG EKG Interpretation  Date/Time:  Saturday September 15 2019 00:40:17 EST Ventricular Rate:  90 PR Interval:    QRS  Duration: 129 QT Interval:  393 QTC Calculation: 481 R Axis:   80 Text Interpretation: Sinus rhythm IVCD, consider atypical RBBB Baseline wander in lead(s) V1 Artifact No old tracing to compare Confirmed by Rolland Porter [id number] on 09/15/2019 12:42:03 AM   Radiology DG Ankle Complete Left  Result Date: 09/15/2019 CLINICAL DATA:  Initial evaluation for pain and swelling to left ankle for 1 month. EXAM: LEFT ANKLE COMPLETE - 3+ VIEW COMPARISON:  Prior radiograph from 05/27/2019. FINDINGS: No acute fracture or dislocation. No joint effusion. Ankle mortise well approximated. Posterior plantar calcaneal enthesophytes noted. No visible soft tissue swelling or injury. IMPRESSION: 1. No acute osseous abnormality about the left ankle. 2. Posterior and plantar calcaneal enthesophytes. Electronically Signed   By: Jeannine Boga M.D.   On: 09/15/2019 01:48    MRI Left knee without contrast  Jun 27, 2019 IMPRESSION: Negative for meniscal or ligament tear.  Minimal medial and lateral compartment degenerative disease.  Moderate joint effusion.   Electronically Signed   By: Inge Rise M.D.   On: 06/27/2019 08:59  Procedures Procedures (including critical care time)  Medications  Ordered in ED Medications - No data to display  ED Course  I have reviewed the triage vital signs and the nursing notes.  Pertinent labs & imaging results that were available during my care of the patient were reviewed by me and considered in my medical decision making (see chart for details).    MDM Rules/Calculators/A&P                     D-dimer was done to evaluate for possible DVT, she denies chest pain or shortness of breath so chest x-ray was not done.  Her left ankle was x-rayed due to complaints of swelling and pain that seems to be centered around the ankle.  She denotes a lot of injuries to her knees and ankles as a child.  After reviewing her x-rays she was placed in an ASO on her left ankle.   She has an orthopedist she can follow-up and she was encouraged to do that.  At this point I do not see that she is having peripheral edema, I think she just has fat tissue in her lower extremities.  She can follow-up of her primary care doctor as needed.  Final Clinical Impression(s) / ED Diagnoses Final diagnoses:  Acute left ankle pain    Rx / DC Orders ED Discharge Orders    None    OTC ibuprofen   Plan discharge  Devoria Albe, MD, Concha Pyo, MD 09/15/19 [phone]

## 2019-09-15 NOTE — ED Notes (Signed)
We do not have ASO splints. Per Dr. Lynelle Doctor verbal order to apply ace wrap to patient's left foot and ankle.

## 2019-09-15 NOTE — ED Notes (Signed)
Ace wrap applied to left ankle.

## 2019-09-15 NOTE — ED Triage Notes (Signed)
Pt reports swelling to her left ankle x 1 month, mild shortness of breath (speaking complete sentences),  Possibly pregnant, occasional nosebleeds.

## 2020-01-14 ENCOUNTER — Encounter (HOSPITAL_COMMUNITY): Payer: Self-pay | Admitting: Emergency Medicine

## 2020-01-14 ENCOUNTER — Emergency Department (HOSPITAL_COMMUNITY)
Admission: EM | Admit: 2020-01-14 | Discharge: 2020-01-14 | Disposition: A | Discharge status: Home / Self Care | Payer: Self-pay | Attending: Emergency Medicine | Admitting: Emergency Medicine

## 2020-01-14 ENCOUNTER — Other Ambulatory Visit: Payer: Self-pay

## 2020-01-14 DIAGNOSIS — W57XXXA Bitten or stung by nonvenomous insect and other nonvenomous arthropods, initial encounter: Secondary | ICD-10-CM | POA: Insufficient documentation

## 2020-01-14 DIAGNOSIS — Z7722 Contact with and (suspected) exposure to environmental tobacco smoke (acute) (chronic): Secondary | ICD-10-CM | POA: Insufficient documentation

## 2020-01-14 DIAGNOSIS — Y929 Unspecified place or not applicable: Secondary | ICD-10-CM | POA: Insufficient documentation

## 2020-01-14 DIAGNOSIS — S80862A Insect bite (nonvenomous), left lower leg, initial encounter: Secondary | ICD-10-CM | POA: Insufficient documentation

## 2020-01-14 DIAGNOSIS — Y939 Activity, unspecified: Secondary | ICD-10-CM | POA: Insufficient documentation

## 2020-01-14 DIAGNOSIS — Y999 Unspecified external cause status: Secondary | ICD-10-CM | POA: Insufficient documentation

## 2020-01-14 MED ORDER — CLINDAMYCIN HCL 150 MG PO CAPS: 300.0000 mg | ORAL_CAPSULE | Freq: Three times a day (TID) | ORAL | 0 refills | Status: DC

## 2020-01-14 MED ORDER — CLINDAMYCIN HCL 150 MG PO CAPS
300.0000 mg | ORAL_CAPSULE | Freq: Once | ORAL | Status: AC
  Administered 2020-01-14: 300 mg via ORAL
  Filled 2020-01-14: qty 2

## 2020-01-14 MED ORDER — TRIAMCINOLONE ACETONIDE 0.1 % EX CREA: 1.0000 "application " | TOPICAL_CREAM | Freq: Three times a day (TID) | CUTANEOUS | 0 refills | Status: DC

## 2020-01-14 NOTE — ED Provider Notes (Signed)
[facility] EMERGENCY DEPARTMENT Provider Note   CSN: [id number] Arrival date & time: 01/14/20  1112     History Chief Complaint  Patient presents with  . Insect Bite    Autumn Boyd is a 34 y.o. female.  HPI      Autumn Boyd is a 34 y.o. female who presents to the Emergency Department complaining of an insect bite to her left lower leg that occurred yesterday.  She reports a small red "bump" that has now begin draining clear to yellow fluid and has surrounding redness and warmth to the touch.  She complains of an itching sensation with pain upon standing or weightbearing.  She has applied cool compress without relief.  She tried to go to work this morning but states that she noticed increasing redness surrounding the bite mark and came to the ER for further evaluation.  She denies fever, chills, pain to her posterior lower leg, swelling, and red streaking.   Past Medical History:  Diagnosis Date  . Migraines   . PCOS (polycystic ovarian syndrome)     Patient Active Problem List   Diagnosis Date Noted  . Closed fracture of lower end of right radius with routine healing 11/22/18 12/28/2018    Past Surgical History:  Procedure Laterality Date  . WISDOM TOOTH EXTRACTION       OB History    Gravida  1   Para      Term      Preterm      AB  1   Living        SAB  1   TAB      Ectopic      Multiple      Live Births              Family History  Problem Relation Age of Onset  . Hypertension Father   . Diabetes Father   . Diabetes Mother   . Cancer Maternal Grandfather        mouth    Social History   Tobacco Use  . Smoking status: Passive Smoke Exposure - Never Smoker  . Smokeless tobacco: Never Used  Substance Use Topics  . Alcohol use: Yes    Comment: occasionally  . Drug use: No    Home Medications Prior to Admission medications   Medication Sig Start Date End Date Taking? Authorizing Provider  acetaminophen (TYLENOL)  500 MG tablet Take 500 mg by mouth every 6 (six) hours as needed. For pain/fever    [provider]  amitriptyline (ELAVIL) 50 MG tablet TAKE 1 TABLET BY MOUTH ONCE DAILY AT BEDTIME TO PREVENT MIGRAINES 03/14/18   [provider]  BIOTIN FORTE PO Take 2 tablets by mouth daily.    [provider]  cetirizine (ZYRTEC) 10 MG tablet Take 10 mg by mouth daily.    [provider]  FLUoxetine (PROZAC) 20 MG capsule TAKE 1 CAPSULE BY MOUTH ONCE DAILY IN THE EVENING FOR 30 DAYS 04/26/18   [provider]  folic acid (FOLVITE) 400 MCG tablet Take 800 mcg by mouth daily.    [provider]  gabapentin (NEURONTIN) 300 MG capsule Take 300 mg by mouth 2 (two) times daily. 01/14/19   [provider]  HYDROcodone-acetaminophen (NORCO/VICODIN) 5-325 MG tablet Take 1 tablet by mouth every 6 (six) hours as needed for moderate pain. 05/30/19   Vickki Hearing, MD  ibuprofen (ADVIL) 800 MG tablet Take 1 tablet (800 mg total)  by mouth every 8 (eight) hours as needed. 05/30/19   Vickki Hearing, MD  lovastatin (MEVACOR) 20 MG tablet TAKE 1 & 1 2 (ONE & ONE HALF) TABLETS BY MOUTH ONCE DAILY WITH EVENING MEAL FOR 30 DAYS 03/28/18   [provider]  MELATONIN PO Take 1 tablet by mouth at bedtime.    [provider]  metFORMIN (GLUCOPHAGE) 500 MG tablet TAKE 1 TABLET BY MOUTH TWICE DAILY WITH MORNING MEAL AND WITH EVENING MEAL FOR 30 DAYS 05/08/18   [provider]  NIACIN PO Take 1 tablet by mouth daily.    [provider]  Omega-3 Fatty Acids (FISH OIL PO) Take 1 capsule by mouth daily.    [provider]  propranolol (INDERAL) 20 MG tablet TAKE 1 TABLET BY MOUTH TWICE DAILY FOR 30 DAYS 03/28/18   [provider]    Allergies    Amoxicillin and Amoxapine and related  Review of Systems   Review of Systems  Constitutional: Negative for chills and fever.  Respiratory: Negative for shortness of breath.    Gastrointestinal: Negative for nausea and vomiting.  Musculoskeletal: Positive for myalgias (left lower leg pain). Negative for arthralgias.  Skin: Positive for color change and wound (insect bite).  Neurological: Negative for weakness and numbness.  Hematological: Negative for adenopathy.    Physical Exam Updated Vital Signs Pulse 65   Temp 98.1 F (36.7 C) (Oral)   Resp 16   Ht 5\' 5"  (1.651 m)   Wt 129.7 kg   SpO2 96%   BMI 47.59 kg/m   Physical Exam Vitals and nursing note reviewed.  Constitutional:      Appearance: Normal appearance.  Cardiovascular:     Rate and Rhythm: Normal rate and regular rhythm.     Pulses: Normal pulses.  Pulmonary:     Effort: Pulmonary effort is normal.  Musculoskeletal:        General: Normal range of motion.  Skin:    General: Skin is warm.     Capillary Refill: Capillary refill takes less than 2 seconds.     Comments: 1 cm central open lesion with 8 x 8 cm area of surrounding erythema. Small amt of serous fluid drainage. No purulent drainage, fluctuance, target lesion or induration.    Neurological:     General: No focal deficit present.     Mental Status: She is alert.     Sensory: No sensory deficit.     Motor: No weakness.     ED Results / Procedures / Treatments   Labs (all labs ordered are listed, but only abnormal results are displayed) Labs Reviewed - No data to display  EKG None  Radiology No results found.  Procedures Procedures (including critical care time)  Medications Ordered in ED Medications  clindamycin (CLEOCIN) capsule 300 mg (has no administration in time range)    ED Course  I have reviewed the triage vital signs and the nursing notes.  Pertinent labs & imaging results that were available during my care of the patient were reviewed by me and considered in my medical decision making (see chart for details).    MDM Rules/Calculators/A&P                          Patient with likely insect bite  of the lateral left lower leg.  No significant edema noted of the leg.  Neurovascularly intact.  Compartments are soft.  No indication of developing abscess  at this time.  Likely localized reaction to insect bite, possible developing cellulitis.  No concerning symptoms for DVT.  Patient well-appearing, afebrile.  Leading edge of erythema was marked by me.  Patient agrees to treatment plan with elevation, warm compresses, antibiotics and topical steroid cream.  Return precautions discussed.   Final Clinical Impression(s) / ED Diagnoses Final diagnoses:  Insect bite of left lower leg, initial encounter    Rx / DC Orders ED Discharge Orders    None       Rosey Bath 01/14/20 1445    Raeford Razor, MD 01/20/20 1129

## 2020-01-14 NOTE — Discharge Instructions (Signed)
Elevate your left leg when possible.  Wet compresses on and off to the affected area.  Avoid scratching.  Follow-up with your primary doctor or return to the emergency department if you develop any worsening symptoms such as fever, swelling of the area, or significant increase in redness

## 2020-01-14 NOTE — ED Triage Notes (Signed)
Pt reports she thinks she was bit by a spider on the left calf yesterday, reports draining, red, warm to touch, denies fevers

## 2021-01-01 DIAGNOSIS — R059 Cough, unspecified: Secondary | ICD-10-CM | POA: Diagnosis not present

## 2021-01-07 DIAGNOSIS — H6691 Otitis media, unspecified, right ear: Secondary | ICD-10-CM | POA: Diagnosis not present

## 2021-01-07 DIAGNOSIS — J069 Acute upper respiratory infection, unspecified: Secondary | ICD-10-CM | POA: Diagnosis not present

## 2021-03-11 DIAGNOSIS — R7303 Prediabetes: Secondary | ICD-10-CM | POA: Diagnosis not present

## 2021-03-11 DIAGNOSIS — G43009 Migraine without aura, not intractable, without status migrainosus: Secondary | ICD-10-CM | POA: Diagnosis not present

## 2021-03-11 DIAGNOSIS — M79672 Pain in left foot: Secondary | ICD-10-CM | POA: Diagnosis not present

## 2021-03-11 DIAGNOSIS — M79671 Pain in right foot: Secondary | ICD-10-CM | POA: Diagnosis not present

## 2021-03-11 DIAGNOSIS — E669 Obesity, unspecified: Secondary | ICD-10-CM | POA: Diagnosis not present

## 2021-03-11 DIAGNOSIS — G5603 Carpal tunnel syndrome, bilateral upper limbs: Secondary | ICD-10-CM | POA: Diagnosis not present

## 2021-03-31 ENCOUNTER — Encounter: Payer: BC Managed Care – PPO | Admitting: Women's Health

## 2021-04-10 ENCOUNTER — Encounter: Payer: BC Managed Care – PPO | Admitting: Adult Health

## 2021-08-12 IMAGING — DX DG TIBIA/FIBULA 2V*L*
2 series · 2 of 2 positions shown · non-contrast
Comparison: None.

CLINICAL DATA: Patient c/o left knee and ankle pain. Per patient
"squated down [REDACTED] and felt a pull and pop." Per patient swelling
and pain since. Patient states she also fell on [REDACTED] due to leg.
Patient took ibuprofen 800mg and tramadol last night with no relief.
Patient ambulatory. No obvious deformity noted.

EXAM:
LEFT TIBIA AND FIBULA - 2 VIEW

[tibia ap]
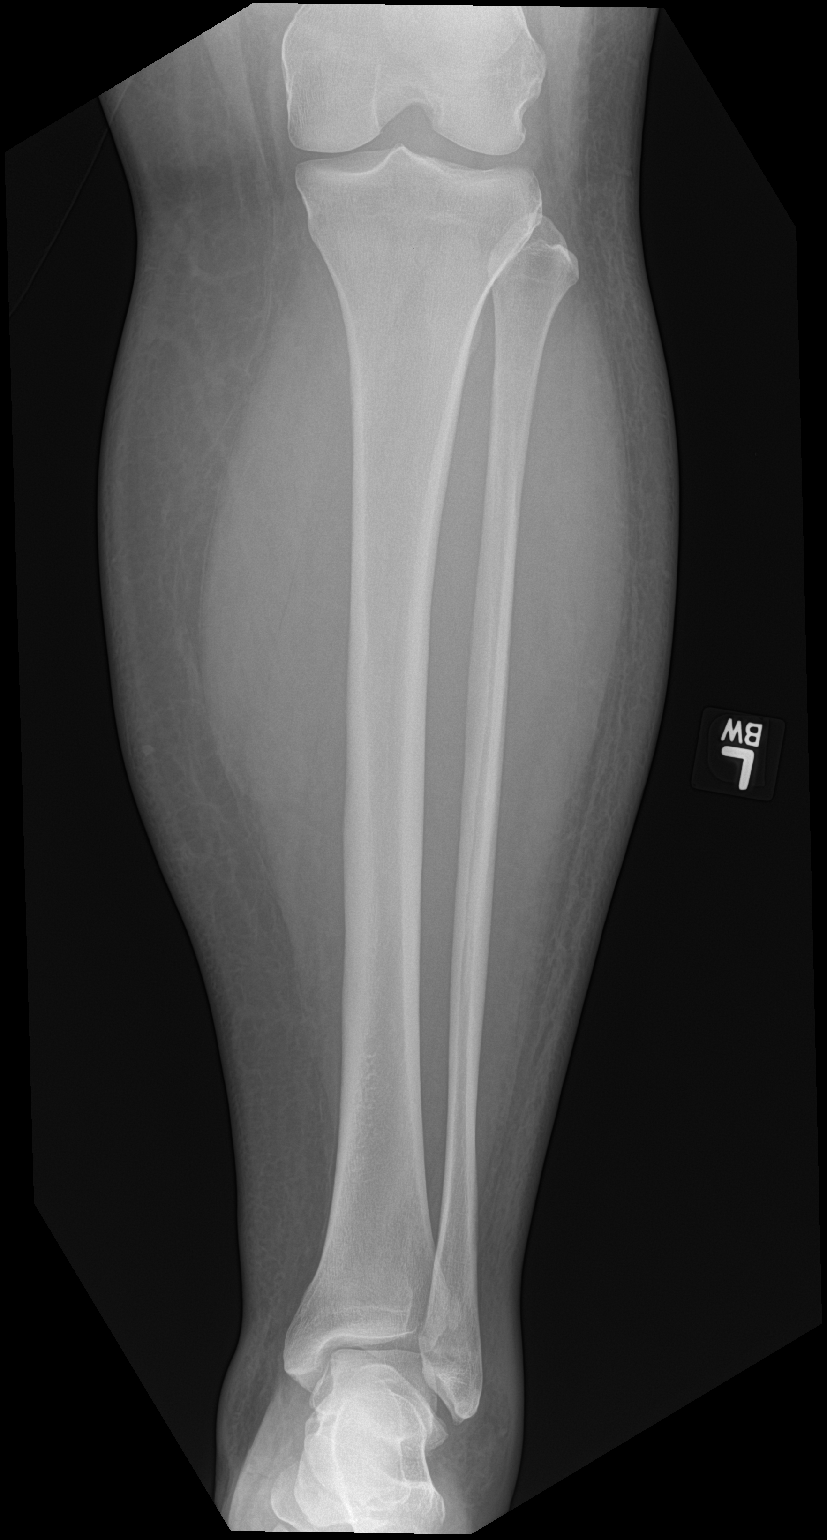

[tibia lat]
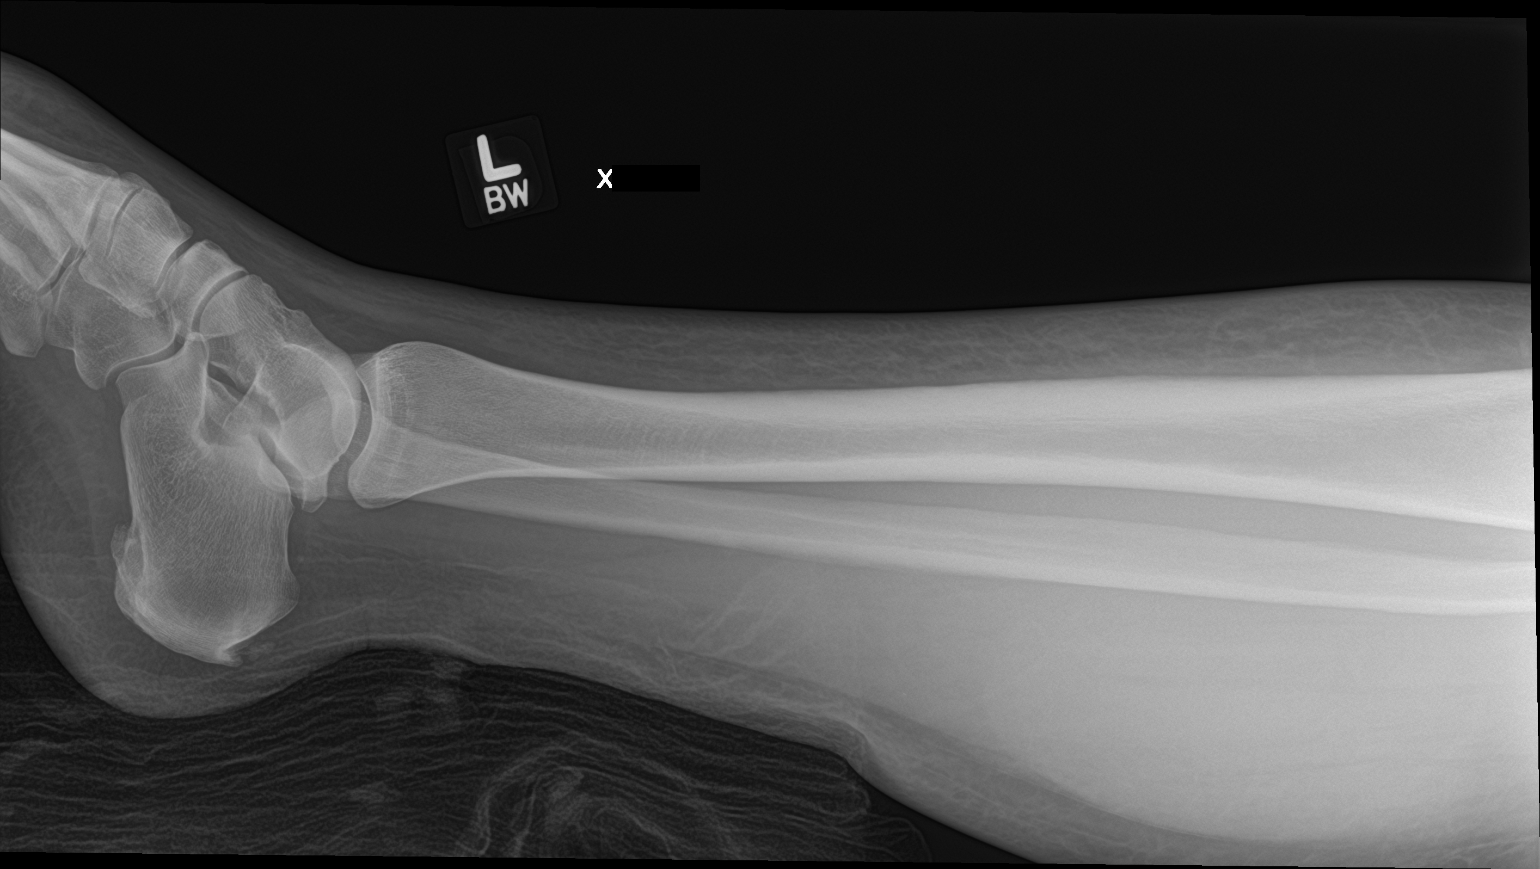

[2 of 2 positions shown; findings below may reference images not displayed]

FINDINGS: No fracture or bone lesion.

Knee and ankle joints are normally spaced and aligned.

Soft tissues are unremarkable.
IMPRESSION: Negative.

## 2021-08-12 IMAGING — DX DG KNEE COMPLETE 4+V*L*
4 series · 4 of 4 positions shown · non-contrast
Comparison: None.

CLINICAL DATA: Pain

EXAM:
LEFT KNEE - COMPLETE 4+ VIEW

[knee ap]
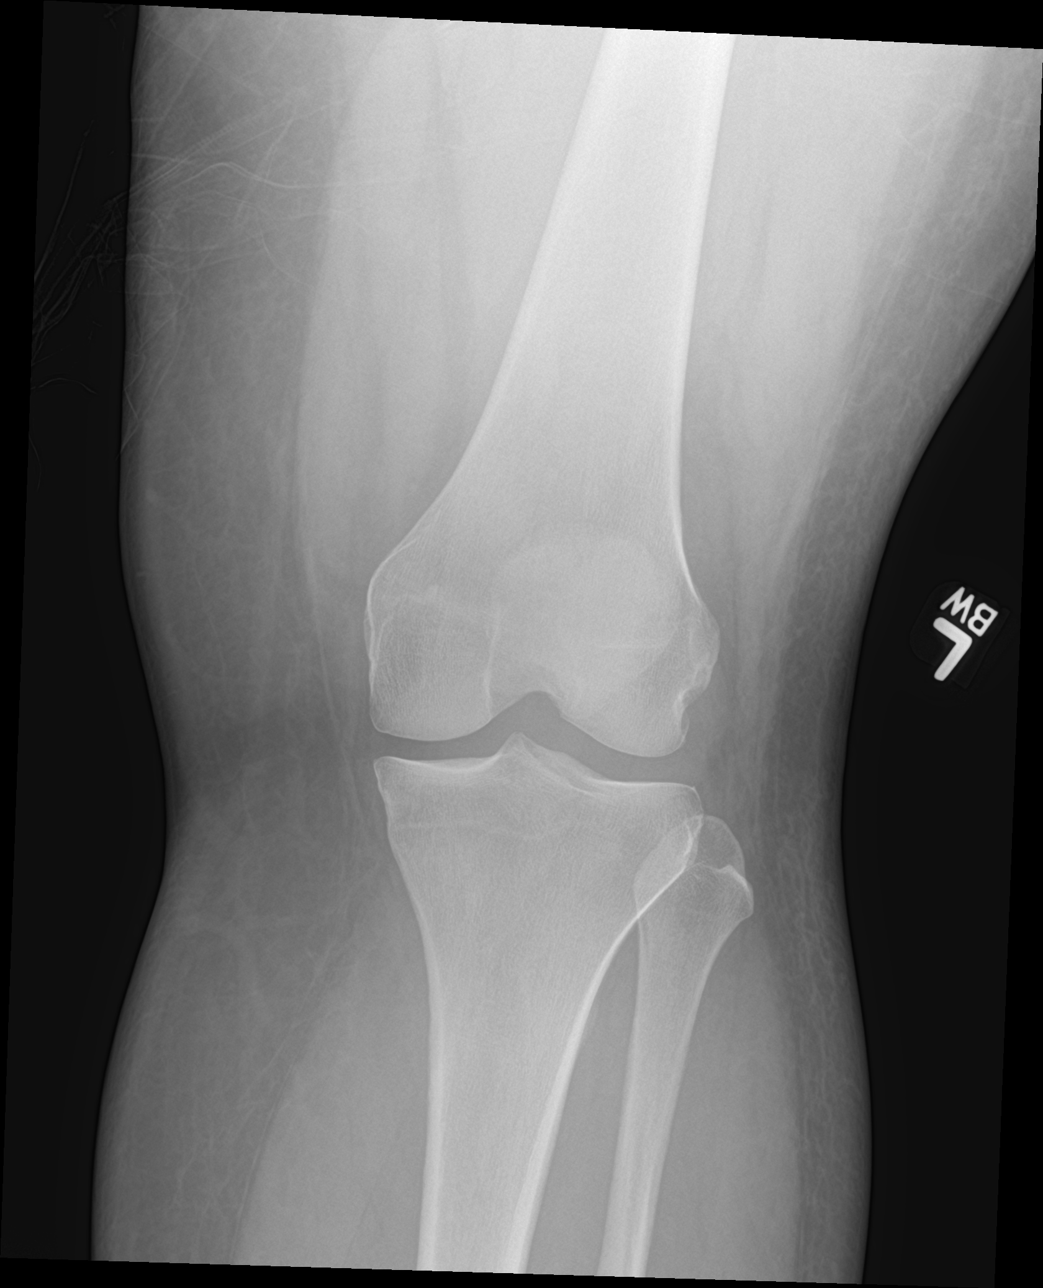

[knee obl (1 of 2)]
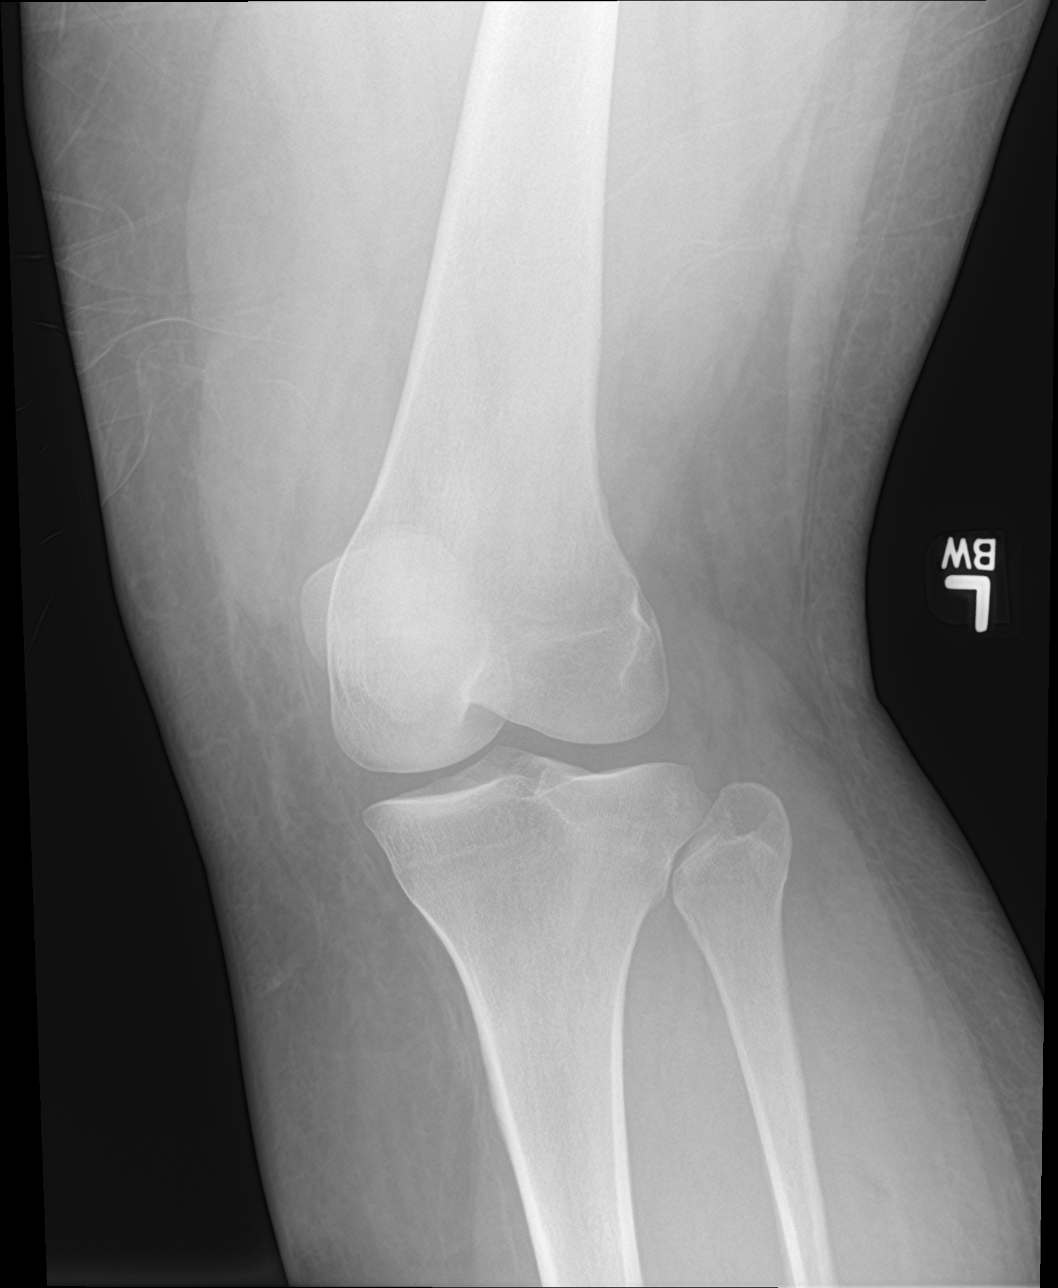

[knee obl (2 of 2)]
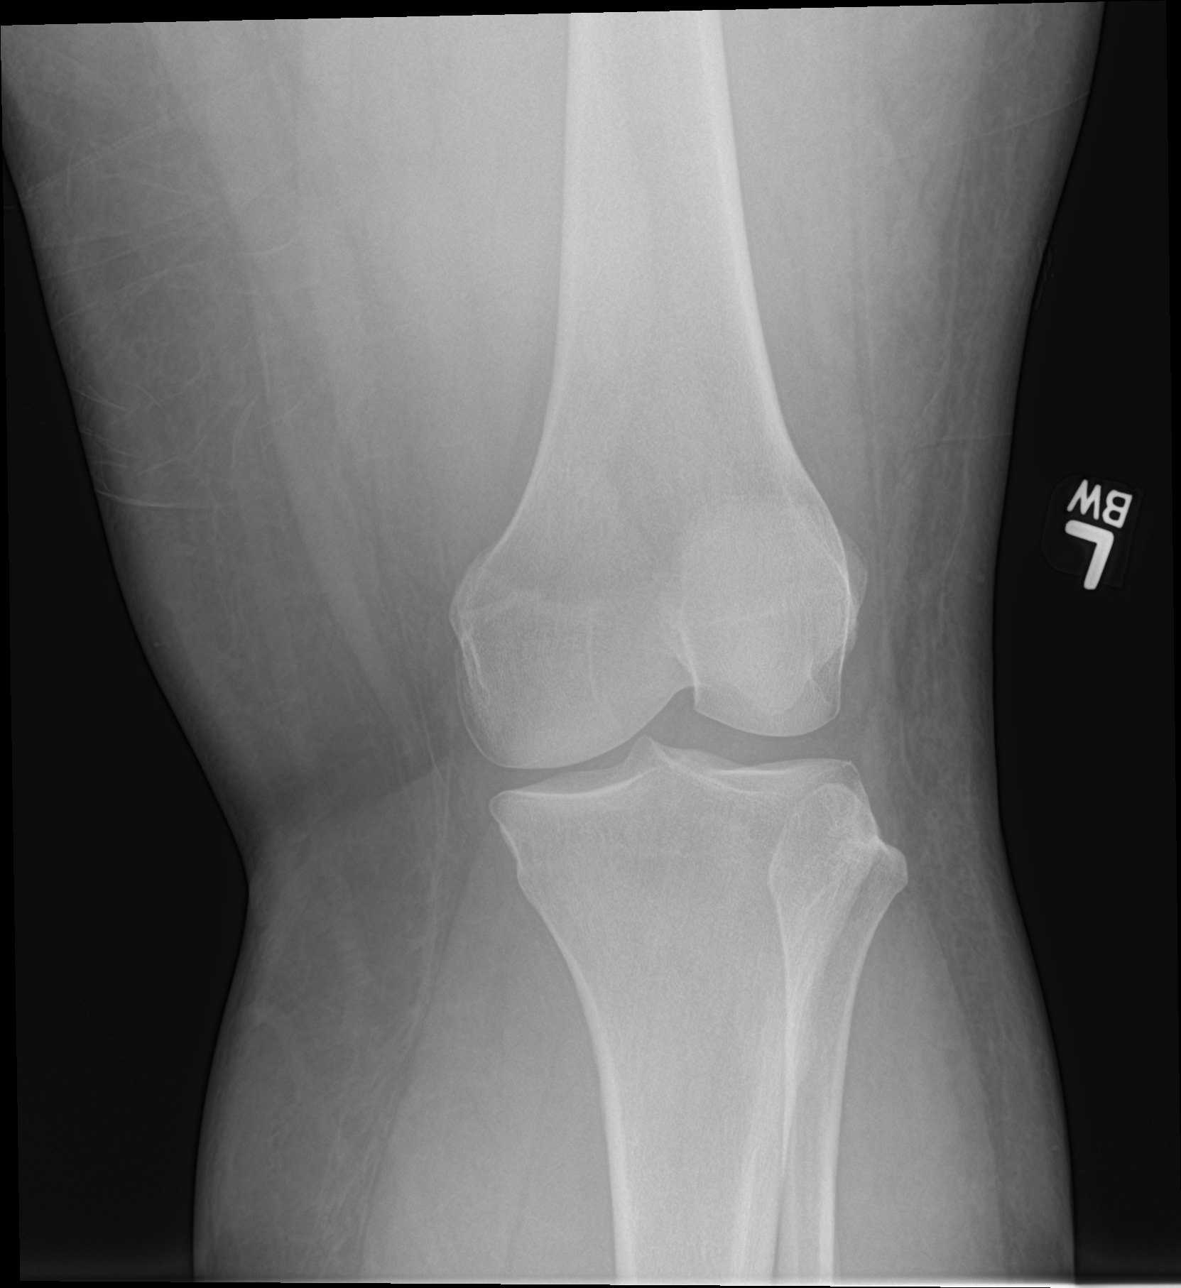

[knee lat]
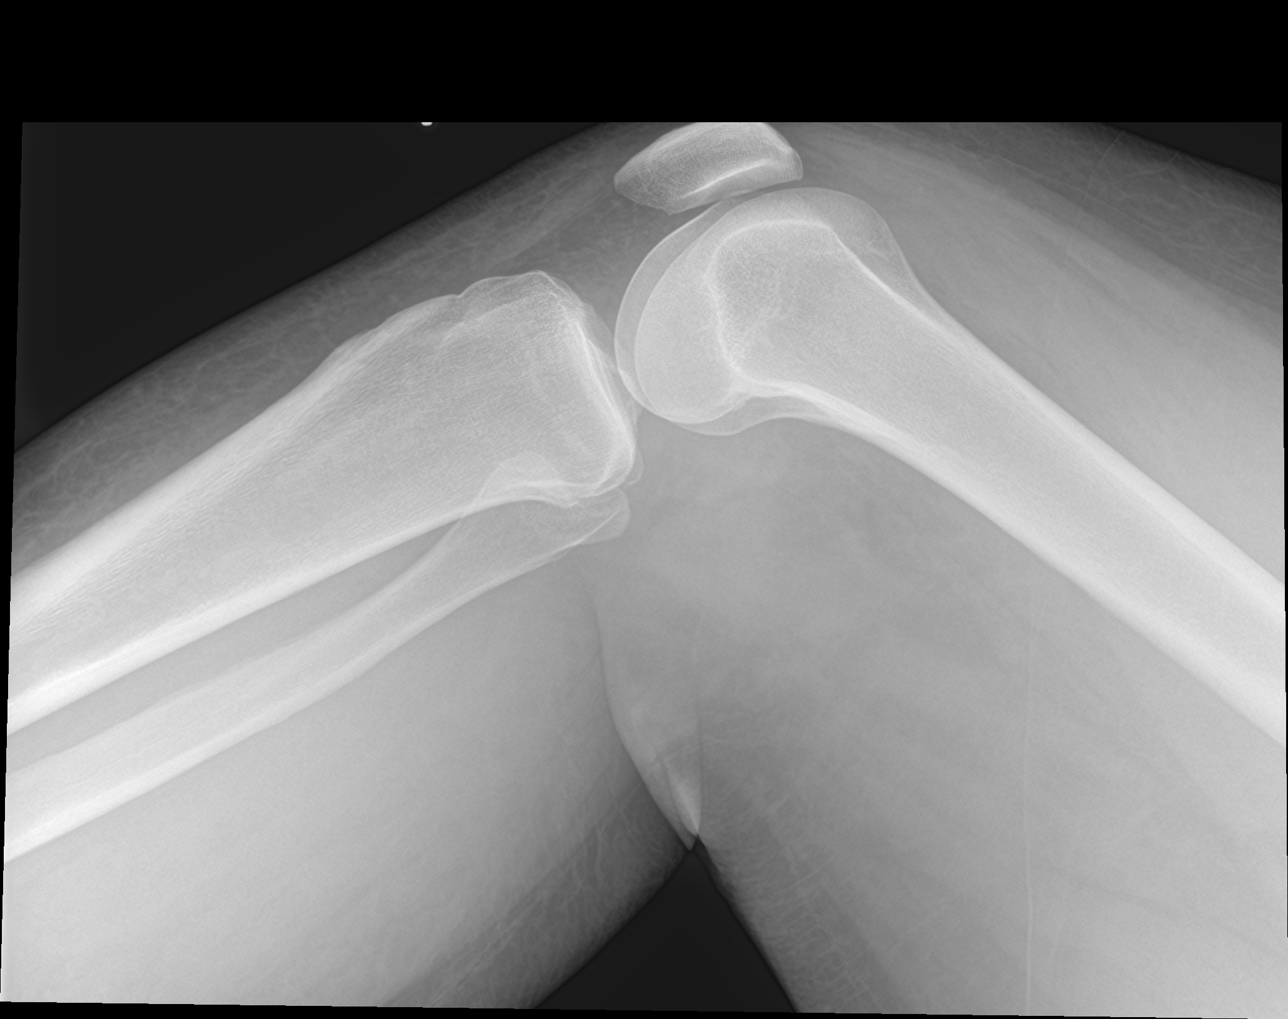

[4 of 4 positions shown; findings below may reference images not displayed]

FINDINGS: There is no acute displaced fracture or dislocation. There is a
small to moderate-sized joint effusion. The joint spaces are well
preserved.
IMPRESSION: Small to moderate-sized joint effusion. No acute bony abnormality or
significant degenerative changes.

## 2022-02-17 ENCOUNTER — Encounter (HOSPITAL_COMMUNITY): Payer: Self-pay

## 2022-02-17 ENCOUNTER — Emergency Department (HOSPITAL_COMMUNITY)
Admission: EM | Admit: 2022-02-17 | Discharge: 2022-02-17 | Disposition: A | Discharge status: Home / Self Care | Payer: Self-pay | Attending: Student | Admitting: Student

## 2022-02-17 ENCOUNTER — Emergency Department (HOSPITAL_COMMUNITY): Payer: Self-pay

## 2022-02-17 ENCOUNTER — Other Ambulatory Visit: Payer: Self-pay

## 2022-02-17 DIAGNOSIS — X58XXXA Exposure to other specified factors, initial encounter: Secondary | ICD-10-CM | POA: Insufficient documentation

## 2022-02-17 DIAGNOSIS — Z79899 Other long term (current) drug therapy: Secondary | ICD-10-CM | POA: Insufficient documentation

## 2022-02-17 DIAGNOSIS — M5432 Sciatica, left side: Secondary | ICD-10-CM | POA: Insufficient documentation

## 2022-02-17 DIAGNOSIS — S60221A Contusion of right hand, initial encounter: Secondary | ICD-10-CM | POA: Insufficient documentation

## 2022-02-17 HISTORY — DX: Post-traumatic stress disorder, unspecified: F43.10

## 2022-02-17 HISTORY — DX: Depression, unspecified: F32.A

## 2022-02-17 HISTORY — DX: Anxiety disorder, unspecified: F41.9

## 2022-02-17 LAB — PREGNANCY, URINE: Preg Test, Ur: NEGATIVE

## 2022-02-17 MED ORDER — HYDROCODONE-ACETAMINOPHEN 5-325 MG PO TABS
2.0000 | ORAL_TABLET | Freq: Once | ORAL | Status: AC
  Administered 2022-02-17: 2 via ORAL
  Filled 2022-02-17: qty 2

## 2022-02-17 MED ORDER — HYDROCODONE-ACETAMINOPHEN 5-325 MG PO TABS: 1.0000 | ORAL_TABLET | ORAL | 0 refills | Status: DC | PRN

## 2022-02-17 MED ORDER — PREDNISONE 50 MG PO TABS: ORAL_TABLET | ORAL | 0 refills | Status: DC

## 2022-02-17 MED ORDER — PREDNISONE 50 MG PO TABS: ORAL_TABLET | ORAL | 0 refills | Status: AC

## 2022-02-17 MED ORDER — HYDROCODONE-ACETAMINOPHEN 5-325 MG PO TABS: 1.0000 | ORAL_TABLET | ORAL | 0 refills | Status: AC | PRN

## 2022-02-17 NOTE — ED Triage Notes (Signed)
Patient states she was getting ready for work when she pushed her right hand down and felt a pop in her right 5th finger with now swelling and pain to entire hand. Patient also complains of lower back pain.

## 2022-02-17 NOTE — ED Provider Notes (Signed)
[facility] EMERGENCY DEPARTMENT Provider Note   CSN: [id number] Arrival date & time: 02/17/22  7517     History  Chief Complaint  Patient presents with   Hand Pain   Back Pain    Autumn Boyd is a 36 y.o. female.  Pt complains of pain in her right hand and in her left low back.  Pt reports pain goes to hip and down her left leg.  Pt reports a shooting pain down her leg.  Pt reports she had a pop in her finger   The history is provided by the patient. No language interpreter was used.  Hand Pain This is a new problem. The problem has been gradually worsening. Nothing aggravates the symptoms. Nothing relieves the symptoms. She has tried nothing for the symptoms. The treatment provided no relief.  Back Pain      Home Medications Prior to Admission medications   Medication Sig Start Date End Date Taking? Authorizing Provider  atorvastatin (LIPITOR) 10 MG tablet Take 10 mg by mouth daily. 12/04/21  Yes [provider]  BIOTIN FORTE PO Take 2 tablets by mouth daily.   Yes [provider]  folic acid (FOLVITE) 400 MCG tablet Take 800 mcg by mouth daily.   Yes [provider]  furosemide (LASIX) 40 MG tablet Take 40 mg by mouth daily. 01/20/22  Yes [provider]  HYDROcodone-acetaminophen (NORCO/VICODIN) 5-325 MG tablet Take 1 tablet by mouth every 4 (four) hours as needed for moderate pain. 02/17/22 02/17/23 Yes Elson Areas, PA-C  NIACIN PO Take 1 tablet by mouth daily.   Yes [provider]  Omega-3 Fatty Acids (FISH OIL PO) Take 1 capsule by mouth daily.   Yes [provider]  predniSONE (DELTASONE) 50 MG tablet One tablet a day 02/17/22  Yes Elson Areas, PA-C  sertraline (ZOLOFT) 50 MG tablet Take 50 mg by mouth daily. 01/13/22  Yes [provider]  traZODone (DESYREL) 50 MG tablet Take 50-100 mg by mouth at bedtime.   Yes [provider]      Allergies    Amoxicillin, Amoxicillin-pot clavulanate,  and Amoxapine and related    Review of Systems   Review of Systems  Musculoskeletal:  Positive for back pain and myalgias.  All other systems reviewed and are negative.   Physical Exam Updated Vital Signs BP 133/70   Pulse (!) 54   Temp 97.9 F (36.6 C) (Oral)   Resp 17   Ht 5\' 5"  (1.651 m)   Wt 128 kg   SpO2 100%   BMI 46.96 kg/m  Physical Exam Vitals and nursing note reviewed.  Constitutional:      Appearance: She is well-developed.  HENT:     Head: Normocephalic.  Eyes:     Pupils: Pupils are equal, round, and reactive to light.  Cardiovascular:     Rate and Rhythm: Normal rate.  Pulmonary:     Effort: Pulmonary effort is normal.  Abdominal:     General: Abdomen is flat. There is no distension.  Musculoskeletal:        General: Swelling and tenderness present.     Cervical back: Normal range of motion.     Comments: Tender right hand,  pain with movement,   Low back no rash,  tender left thigh and left buttock    Skin:    General: Skin is warm.  Neurological:     General: No focal deficit present.     Mental Status:  She is alert and oriented to person, place, and time.  Psychiatric:        Mood and Affect: Mood normal.     ED Results / Procedures / Treatments   Labs (all labs ordered are listed, but only abnormal results are displayed) Labs Reviewed  PREGNANCY, URINE    EKG None  Radiology DG Hand Complete Right  Result Date: 02/17/2022 CLINICAL DATA:  5th digit pain after injury. EXAM: RIGHT HAND - COMPLETE 3+ VIEW COMPARISON:  None Available. FINDINGS: There is no evidence of fracture or dislocation. There is no evidence of arthropathy or other focal bone abnormality. Soft tissues are unremarkable. IMPRESSION: Negative. Electronically Signed   By: Kennith Center M.D.   On: 02/17/2022 12:08   DG Lumbar Spine Complete  Result Date: 02/17/2022 CLINICAL DATA:  Pain. EXAM: LUMBAR SPINE - COMPLETE 4+ VIEW COMPARISON:  None Available. FINDINGS: There is  no evidence of lumbar spine fracture. Alignment is normal. Intervertebral disc spaces are maintained. IMPRESSION: Negative. Electronically Signed   By: Kennith Center M.D.   On: 02/17/2022 12:07    Procedures Procedures    Medications Ordered in ED Medications  HYDROcodone-acetaminophen (NORCO/VICODIN) 5-325 MG per tablet 2 tablet (2 tablets Oral Given 02/17/22 1105)    ED Course/ Medical Decision Making/ A&P                           Medical Decision Making Pt reports she has pain in her right hand and her left leg  Amount and/or Complexity of Data Reviewed Labs: ordered. Decision-making details documented in ED Course.    Details: labs ordered reviewed and interpreted,  UPt is negative Radiology: ordered and independent interpretation performed. Decision-making details documented in ED Course.    Details: xray hand and ls spine  no acute abnormality  Risk Prescription drug management.           Final Clinical Impression(s) / ED Diagnoses Final diagnoses:  Sciatica, left side  Contusion of right hand, initial encounter    Rx / DC Orders ED Discharge Orders          Ordered    predniSONE (DELTASONE) 50 MG tablet        02/17/22 1253    HYDROcodone-acetaminophen (NORCO/VICODIN) 5-325 MG tablet  Every 4 hours PRN        02/17/22 1253           An After Visit Summary was printed and given to the patient.    Elson Areas, Cordelia Poche 02/17/22 1317    Glendora Score, MD 02/17/22 [phone]

## 2022-09-06 DIAGNOSIS — G43009 Migraine without aura, not intractable, without status migrainosus: Secondary | ICD-10-CM | POA: Diagnosis not present

## 2022-09-06 DIAGNOSIS — M7711 Lateral epicondylitis, right elbow: Secondary | ICD-10-CM | POA: Diagnosis not present

## 2022-09-06 DIAGNOSIS — G5603 Carpal tunnel syndrome, bilateral upper limbs: Secondary | ICD-10-CM | POA: Diagnosis not present

## 2022-09-06 DIAGNOSIS — R7303 Prediabetes: Secondary | ICD-10-CM | POA: Diagnosis not present

## 2022-09-06 DIAGNOSIS — G4700 Insomnia, unspecified: Secondary | ICD-10-CM | POA: Diagnosis not present

## 2022-09-06 DIAGNOSIS — F329 Major depressive disorder, single episode, unspecified: Secondary | ICD-10-CM | POA: Diagnosis not present

## 2022-09-06 DIAGNOSIS — R030 Elevated blood-pressure reading, without diagnosis of hypertension: Secondary | ICD-10-CM | POA: Diagnosis not present

## 2022-09-06 DIAGNOSIS — E785 Hyperlipidemia, unspecified: Secondary | ICD-10-CM | POA: Diagnosis not present

## 2022-09-06 DIAGNOSIS — L723 Sebaceous cyst: Secondary | ICD-10-CM | POA: Diagnosis not present

## 2022-09-06 DIAGNOSIS — R609 Edema, unspecified: Secondary | ICD-10-CM | POA: Diagnosis not present

## 2022-09-06 DIAGNOSIS — M5432 Sciatica, left side: Secondary | ICD-10-CM | POA: Diagnosis not present

## 2023-01-12 DIAGNOSIS — R030 Elevated blood-pressure reading, without diagnosis of hypertension: Secondary | ICD-10-CM | POA: Diagnosis not present

## 2023-01-12 DIAGNOSIS — R7303 Prediabetes: Secondary | ICD-10-CM | POA: Diagnosis not present

## 2023-01-12 DIAGNOSIS — E785 Hyperlipidemia, unspecified: Secondary | ICD-10-CM | POA: Diagnosis not present

## 2023-01-18 DIAGNOSIS — F329 Major depressive disorder, single episode, unspecified: Secondary | ICD-10-CM | POA: Diagnosis not present

## 2023-01-18 DIAGNOSIS — Z0001 Encounter for general adult medical examination with abnormal findings: Secondary | ICD-10-CM | POA: Diagnosis not present

## 2023-01-18 DIAGNOSIS — G43009 Migraine without aura, not intractable, without status migrainosus: Secondary | ICD-10-CM | POA: Diagnosis not present

## 2023-01-18 DIAGNOSIS — R609 Edema, unspecified: Secondary | ICD-10-CM | POA: Diagnosis not present

## 2023-01-18 DIAGNOSIS — E785 Hyperlipidemia, unspecified: Secondary | ICD-10-CM | POA: Diagnosis not present

## 2023-01-18 DIAGNOSIS — G4700 Insomnia, unspecified: Secondary | ICD-10-CM | POA: Diagnosis not present

## 2023-01-18 DIAGNOSIS — M7712 Lateral epicondylitis, left elbow: Secondary | ICD-10-CM | POA: Diagnosis not present

## 2023-01-18 DIAGNOSIS — M7711 Lateral epicondylitis, right elbow: Secondary | ICD-10-CM | POA: Diagnosis not present

## 2023-01-18 DIAGNOSIS — E282 Polycystic ovarian syndrome: Secondary | ICD-10-CM | POA: Diagnosis not present

## 2023-01-18 DIAGNOSIS — G5603 Carpal tunnel syndrome, bilateral upper limbs: Secondary | ICD-10-CM | POA: Diagnosis not present

## 2023-02-19 DIAGNOSIS — B0089 Other herpesviral infection: Secondary | ICD-10-CM | POA: Diagnosis not present

## 2023-02-19 DIAGNOSIS — I891 Lymphangitis: Secondary | ICD-10-CM | POA: Diagnosis not present
